# Patient Record
Sex: Female | Born: 1955 | Race: White | Hispanic: No | Marital: Married | Smoking: Former smoker
Health system: Southern US, Community
[De-identification: ages and names within clinical notes are randomized; demographics above are authoritative.]

## PROBLEM LIST (undated history)

## (undated) DIAGNOSIS — T7840XA Allergy, unspecified, initial encounter: Secondary | ICD-10-CM

## (undated) DIAGNOSIS — F419 Anxiety disorder, unspecified: Secondary | ICD-10-CM

## (undated) DIAGNOSIS — M1990 Unspecified osteoarthritis, unspecified site: Secondary | ICD-10-CM

## (undated) DIAGNOSIS — M5030 Other cervical disc degeneration, unspecified cervical region: Secondary | ICD-10-CM

## (undated) DIAGNOSIS — F32A Depression, unspecified: Secondary | ICD-10-CM

## (undated) DIAGNOSIS — K219 Gastro-esophageal reflux disease without esophagitis: Secondary | ICD-10-CM

## (undated) DIAGNOSIS — K52832 Lymphocytic colitis: Secondary | ICD-10-CM

## (undated) DIAGNOSIS — F329 Major depressive disorder, single episode, unspecified: Secondary | ICD-10-CM

## (undated) DIAGNOSIS — F410 Panic disorder [episodic paroxysmal anxiety] without agoraphobia: Secondary | ICD-10-CM

## (undated) DIAGNOSIS — S82009A Unspecified fracture of unspecified patella, initial encounter for closed fracture: Secondary | ICD-10-CM

## (undated) HISTORY — PX: OTHER SURGICAL HISTORY: SHX169

## (undated) HISTORY — PX: COLONOSCOPY: SHX174

## (undated) HISTORY — DX: Panic disorder (episodic paroxysmal anxiety): F41.0

## (undated) HISTORY — DX: Lymphocytic colitis: K52.832

## (undated) HISTORY — DX: Unspecified fracture of unspecified patella, initial encounter for closed fracture: S82.009A

## (undated) HISTORY — DX: Other cervical disc degeneration, unspecified cervical region: M50.30

## (undated) HISTORY — DX: Anxiety disorder, unspecified: F41.9

## (undated) HISTORY — DX: Unspecified osteoarthritis, unspecified site: M19.90

## (undated) HISTORY — DX: Allergy, unspecified, initial encounter: T78.40XA

## (undated) HISTORY — DX: Major depressive disorder, single episode, unspecified: F32.9

## (undated) HISTORY — DX: Depression, unspecified: F32.A

## (undated) HISTORY — PX: ESOPHAGOGASTRODUODENOSCOPY ENDOSCOPY: SHX5814

## (undated) HISTORY — DX: Gastro-esophageal reflux disease without esophagitis: K21.9

---

## 1999-11-22 ENCOUNTER — Encounter: Payer: Self-pay | Admitting: Obstetrics and Gynecology

## 1999-11-22 ENCOUNTER — Encounter: Admission: RE | Admit: 1999-11-22 | Discharge: 1999-11-22 | Payer: Self-pay | Admitting: Obstetrics and Gynecology

## 2001-05-03 ENCOUNTER — Encounter: Admission: RE | Admit: 2001-05-03 | Discharge: 2001-05-03 | Payer: Self-pay | Admitting: Obstetrics and Gynecology

## 2001-05-03 ENCOUNTER — Encounter: Payer: Self-pay | Admitting: Obstetrics and Gynecology

## 2002-05-05 ENCOUNTER — Encounter: Admission: RE | Admit: 2002-05-05 | Discharge: 2002-05-05 | Payer: Self-pay | Admitting: Obstetrics and Gynecology

## 2002-05-05 ENCOUNTER — Encounter: Payer: Self-pay | Admitting: Obstetrics and Gynecology

## 2003-05-08 ENCOUNTER — Encounter: Admission: RE | Admit: 2003-05-08 | Discharge: 2003-05-08 | Payer: Self-pay | Admitting: Obstetrics and Gynecology

## 2004-05-13 ENCOUNTER — Encounter: Admission: RE | Admit: 2004-05-13 | Discharge: 2004-05-13 | Payer: Self-pay | Admitting: Obstetrics and Gynecology

## 2004-05-21 ENCOUNTER — Encounter: Admission: RE | Admit: 2004-05-21 | Discharge: 2004-05-21 | Payer: Self-pay | Admitting: Obstetrics and Gynecology

## 2005-05-14 ENCOUNTER — Encounter: Admission: RE | Admit: 2005-05-14 | Discharge: 2005-05-14 | Payer: Self-pay | Admitting: Obstetrics and Gynecology

## 2006-02-02 ENCOUNTER — Ambulatory Visit: Payer: Self-pay | Admitting: Internal Medicine

## 2006-02-02 ENCOUNTER — Emergency Department (HOSPITAL_COMMUNITY): Admission: EM | Admit: 2006-02-02 | Discharge: 2006-02-02 | Payer: Self-pay | Admitting: Emergency Medicine

## 2006-06-03 ENCOUNTER — Ambulatory Visit: Payer: Self-pay | Admitting: Internal Medicine

## 2006-06-04 ENCOUNTER — Ambulatory Visit: Payer: Self-pay | Admitting: Internal Medicine

## 2006-06-04 ENCOUNTER — Encounter: Payer: Self-pay | Admitting: Internal Medicine

## 2006-06-23 ENCOUNTER — Ambulatory Visit: Payer: Self-pay | Admitting: Internal Medicine

## 2006-09-17 ENCOUNTER — Encounter: Admission: RE | Admit: 2006-09-17 | Discharge: 2006-09-17 | Payer: Self-pay | Admitting: Obstetrics and Gynecology

## 2006-09-23 ENCOUNTER — Encounter: Admission: RE | Admit: 2006-09-23 | Discharge: 2006-09-23 | Payer: Self-pay | Admitting: Obstetrics and Gynecology

## 2007-06-12 DIAGNOSIS — J309 Allergic rhinitis, unspecified: Secondary | ICD-10-CM | POA: Insufficient documentation

## 2007-06-12 DIAGNOSIS — F341 Dysthymic disorder: Secondary | ICD-10-CM | POA: Insufficient documentation

## 2008-03-06 ENCOUNTER — Telehealth: Payer: Self-pay | Admitting: Internal Medicine

## 2008-04-20 ENCOUNTER — Encounter: Admission: RE | Admit: 2008-04-20 | Discharge: 2008-04-20 | Payer: Self-pay | Admitting: Geriatric Medicine

## 2008-05-26 ENCOUNTER — Ambulatory Visit: Payer: Self-pay | Admitting: Internal Medicine

## 2008-05-26 DIAGNOSIS — F410 Panic disorder [episodic paroxysmal anxiety] without agoraphobia: Secondary | ICD-10-CM | POA: Insufficient documentation

## 2008-05-26 DIAGNOSIS — K5289 Other specified noninfective gastroenteritis and colitis: Secondary | ICD-10-CM | POA: Insufficient documentation

## 2008-05-26 DIAGNOSIS — R12 Heartburn: Secondary | ICD-10-CM | POA: Insufficient documentation

## 2008-05-29 ENCOUNTER — Encounter: Payer: Self-pay | Admitting: Internal Medicine

## 2008-12-21 ENCOUNTER — Encounter: Admission: RE | Admit: 2008-12-21 | Discharge: 2008-12-21 | Payer: Self-pay | Admitting: Geriatric Medicine

## 2009-01-01 ENCOUNTER — Encounter: Admission: RE | Admit: 2009-01-01 | Discharge: 2009-01-01 | Payer: Self-pay | Admitting: Geriatric Medicine

## 2009-04-04 ENCOUNTER — Other Ambulatory Visit: Admission: RE | Admit: 2009-04-04 | Discharge: 2009-04-04 | Payer: Self-pay | Admitting: Obstetrics and Gynecology

## 2009-04-17 ENCOUNTER — Ambulatory Visit: Payer: Self-pay | Admitting: Internal Medicine

## 2009-07-31 ENCOUNTER — Encounter: Payer: Self-pay | Admitting: Internal Medicine

## 2009-10-25 ENCOUNTER — Encounter (INDEPENDENT_AMBULATORY_CARE_PROVIDER_SITE_OTHER): Payer: Self-pay | Admitting: *Deleted

## 2009-10-29 ENCOUNTER — Encounter (INDEPENDENT_AMBULATORY_CARE_PROVIDER_SITE_OTHER): Payer: Self-pay

## 2009-10-30 ENCOUNTER — Ambulatory Visit: Payer: Self-pay | Admitting: Internal Medicine

## 2009-11-12 ENCOUNTER — Ambulatory Visit: Payer: Self-pay | Admitting: Internal Medicine

## 2009-11-13 ENCOUNTER — Encounter: Payer: Self-pay | Admitting: Internal Medicine

## 2010-02-11 ENCOUNTER — Encounter: Payer: Self-pay | Admitting: Obstetrics and Gynecology

## 2010-02-19 NOTE — Procedures (Signed)
Summary: Upper Endoscopy  Patient: Tacarra Justo Note: All result statuses are Final unless otherwise noted.  Tests: (1) Upper Endoscopy (EGD)   EGD Upper Endoscopy       DONE     [facility]     [address]     [place], [place]  [address]           ENDOSCOPY PROCEDURE REPORT           PATIENT:  Avalon, Coppinger  MR#:  [id number]     BIRTHDATE:  25-May-1955, 54 yrs. old  GENDER:  female           ENDOSCOPIST:  Hedwig Morton. Juanda Chance, MD     Referred by:  Christia Reading, M.D.           PROCEDURE DATE:  11/12/2009     PROCEDURE:  EGD with biopsy, [address]     ASA CLASS:  Class I     INDICATIONS:  "scratchy throat", s/p ENT evaluation, initially     responded to Nexiem but not recently           MEDICATIONS:   Versed 3 mg, Fentanyl 50 mcg     TOPICAL ANESTHETIC:  Exactacain Spray           DESCRIPTION OF PROCEDURE:   After the risks benefits and     alternatives of the procedure were thoroughly explained, informed     consent was obtained.  The LB GIF-H180 [id number] endoscope was     introduced through the mouth and advanced to the second portion of     the duodenum, without limitations.  The instrument was slowly     withdrawn as the mucosa was fully examined.     <<PROCEDUREIMAGES>>           Normal GE junction was noted. With standard forceps, a biopsy was     obtained and sent to pathology (see image1, image2, image6, and     image7).  The upper, middle, and distal third of the esophagus     were carefully inspected and no abnormalities were noted. The     z-line was well seen at the GEJ. The endoscope was pushed into the     fundus which was normal including a retroflexed view. The     antrum,gastric body, first and second part of the duodenum were     unremarkable (see image9, image8, image5, image4, and image3).     Retroflexed views revealed no abnormalities.    The scope was then     withdrawn from the patient and the procedure completed.           COMPLICATIONS:  None         ENDOSCOPIC IMPRESSION:     1) Normal GE junction     2) Normal EGD     no evidence of esophagitis, hiatal hernia or stricture     RECOMMENDATIONS:     1) Await biopsy results     continue nexiem for now,           REPEAT EXAM:  In 0 year(s) for.           ______________________________     Hedwig Morton. Juanda Chance, MD           CC:  Merlene Laughter, MD           n.     Rosalie DoctorHedwig Morton. Brodie at 11/12/2009 11:21 AM  Page 2 of 3   Abrea, Henle, [id number]  Note: An exclamation mark (!) indicates a result that was not dispersed into the flowsheet. Document Creation Date: 11/12/2009 11:23 AM _______________________________________________________________________  (1) Order result status: Final Collection or observation date-time: 11/12/2009 11:13 Requested date-time:  Receipt date-time:  Reported date-time:  Referring Physician:   Ordering Physician: Lina Sar [phone]) Specimen Source:  Source: Launa Grill Order Number: [id number] Lab site:

## 2010-02-19 NOTE — Letter (Signed)
Summary: Patient Notice-Endo Biopsy Results  [facility]  [address] [place], [place] [address]   Phone: [phone]  Fax: [phone]        November 13, 2009 MRN: [id number]    Vicki Jarvis [address] [place], [place]  [address]    Dear Vicki Jarvis,  I am pleased to inform you that the biopsies taken during your recent endoscopic examination did not show any evidence of cancer upon pathologic examination.The biopsies show mild inflammation due to reflux  Additional information/recommendations:  x  _No further action is needed at this time.  Please follow-up with      your primary care physician for your other healthcare needs.  __ Please call [phone] to schedule a return visit to review      your condition.  _x_ Continue with the treatment plan as outlined on the day of your      exam.  I would consider You seeing a pulmonologist to assess cough/possible upper airway inflammation. Let me know and I would make a referral.   Please call us if you are having persistent problems or have questions about your condition that have not been fully answered at this time.  Sincerely,  Hart Carwin MD  This letter has been electronically signed by your physician.  Appended Document: Patient Notice-Endo Biopsy Results letter mailed 10.28.11

## 2010-02-19 NOTE — Letter (Signed)
Summary: EGD Instructions  [facility]  [address] [place], [place] [address]   Phone: [phone]  Fax: [phone]       Vicki Jarvis    1955/04/29    MRN: [id number]       Procedure Day /Date: 05/15/09 Tuesday     Arrival Time: 10:00 am     Procedure Time: 11:00 am     Location of Procedure:                    _ x _ [facility] (4th Floor)  PREPARATION FOR ENDOSCOPY   On 05/15/09 THE DAY OF THE PROCEDURE:  1.   No solid foods, milk or milk products are allowed after midnight the night before your procedure.  2.   Do not drink anything colored red or purple.  Avoid juices with pulp.  No orange juice.  3.  You may drink clear liquids until 9:00 am, which is 2 hours before your procedure.                                                                                                CLEAR LIQUIDS INCLUDE: Water Jello Ice Popsicles Tea (sugar ok, no milk/cream) Powdered fruit flavored drinks Coffee (sugar ok, no milk/cream) Gatorade Juice: apple, white grape, white cranberry  Lemonade Clear bullion, consomm, broth Carbonated beverages (any kind) Strained chicken noodle soup Hard Candy   MEDICATION INSTRUCTIONS  Unless otherwise instructed, you should take regular prescription medications with a small sip of water as early as possible the morning of your procedure.                  OTHER INSTRUCTIONS  You will need a responsible adult at least 54 years of age to accompany you and drive you home.   This person must remain in the waiting room during your procedure.  Wear loose fitting clothing that is easily removed.  Leave jewelry and other valuables at home.  However, you may wish to bring a book to read or an iPod/MP3 player to listen to music as you wait for your procedure to start.  Remove all body piercing jewelry and leave at home.  Total time from sign-in until discharge is approximately 2-3 hours.  You should go  home directly after your procedure and rest.  You can resume normal activities the day after your procedure.  The day of your procedure you should not:   Drive   Make legal decisions   Operate machinery   Drink alcohol   Return to work  You will receive specific instructions about eating, activities and medications before you leave.    The above instructions have been reviewed and explained to me by   Hortense Ramal CMA Duncan Dull)  April 17, 2009 11:07 AM     I fully understand and can verbalize these instructions _____________________________ Date 04/17/09

## 2010-02-19 NOTE — Letter (Signed)
Summary: Pre Visit Letter Revised  [facility]  [address] [place], [place] [address]   Phone: [phone]  Fax: [phone]        10/25/2009 MRN: [id number] [facility] [address] [place], [place]  [address]             Procedure Date:  11/12/2009   Welcome to the Gastroenterology Division at [facility].    You are scheduled to see a nurse for your pre-procedure visit on 10/30/2009 at 4:30PM on the 3rd floor at [facility], [address]  We ask that you try to arrive at our office 15 minutes prior to your appointment time to allow for check-in.  Please take a minute to review the attached form.  If you answer "Yes" to one or more of the questions on the first page, we ask that you call the person listed at your earliest opportunity.  If you answer "No" to all of the questions, please complete the rest of the form and bring it to your appointment.    Your nurse visit will consist of discussing your medical and surgical history, your immediate family medical history, and your medications.   If you are unable to list all of your medications on the form, please bring the medication bottles to your appointment and we will list them.  We will need to be aware of both prescribed and over the counter drugs.  We will need to know exact dosage information as well.    Please be prepared to read and sign documents such as consent forms, a financial agreement, and acknowledgement forms.  If necessary, and with your consent, a friend or relative is welcome to sit-in on the nurse visit with you.  Please bring your insurance card so that we may make a copy of it.  If your insurance requires a referral to see a specialist, please bring your referral form from your primary care physician.  No co-pay is required for this nurse visit.     If you cannot keep your appointment, please call [phone] to cancel or reschedule prior to your appointment date.   This allows [place] the opportunity to schedule an appointment for another patient in need of care.    Thank you for choosing [facility] for your medical needs.  We appreciate the opportunity to care for you.  Please visit [place] at our website  to learn more about our practice.  Sincerely, The Gastroenterology Division

## 2010-02-19 NOTE — Letter (Signed)
Summary: EGD Instructions  [facility]  [address] [place], [place] [address]   Phone: [phone]  Fax: [phone]       Vicki Jarvis    [birth year]    MRN: [id number]       Procedure Day /Date: Monday 11-12-09     Arrival Time:  10:00 a.m.     Procedure Time: 11:00 a.m.     Location of Procedure:                    _ x _ [facility] (4th Floor)   PREPARATION FOR ENDOSCOPY   On Monday 11-12-09, THE DAY OF THE PROCEDURE:  1.   No solid foods, milk or milk products are allowed after midnight the night before your procedure.  2.   Do not drink anything colored red or purple.  Avoid juices with pulp.  No orange juice.  3.  You may drink clear liquids until 9:00 a.m. , which is 2 hours before your procedure.                                                                                                CLEAR LIQUIDS INCLUDE: Water Jello Ice Popsicles Tea (sugar ok, no milk/cream) Powdered fruit flavored drinks Coffee (sugar ok, no milk/cream) Gatorade Juice: apple, white grape, white cranberry  Lemonade Clear bullion, consomm, broth Carbonated beverages (any kind) Strained chicken noodle soup Hard Candy   MEDICATION INSTRUCTIONS  Unless otherwise instructed, you should take regular prescription medications with a small sip of water as early as possible the morning of your procedure.         OTHER INSTRUCTIONS  You will need a responsible adult at least 54 years of age to accompany you and drive you home.   This person must remain in the waiting room during your procedure.  Wear loose fitting clothing that is easily removed.  Leave jewelry and other valuables at home.  However, you may wish to bring a book to read or an iPod/MP3 player to listen to music as you wait for your procedure to start.  Remove all body piercing jewelry and leave at home.  Total time from sign-in until discharge is approximately 2-3 hours.  You should go  home directly after your procedure and rest.  You can resume normal activities the day after your procedure.  The day of your procedure you should not:   Drive   Make legal decisions   Operate machinery   Drink alcohol   Return to work  You will receive specific instructions about eating, activities and medications before you leave.    The above instructions have been reviewed and explained to me by   Ulis Rias RN  October 30, 2009 4:41 PM     I fully understand and can verbalize these instructions _____________________________ Date _________

## 2010-02-19 NOTE — Medication Information (Signed)
Summary: Nexium Approved/[employer]  Nexium Approved/[employer]   Imported By: Sherian Rein 08/06/2009 07:46:45  _____________________________________________________________________  External Attachment:    Type:   Image     Comment:   External Document

## 2010-02-19 NOTE — Miscellaneous (Signed)
Summary: Lec previsit  Clinical Lists Changes  Observations: Added new observation of ALLERGY REV: Done (10/30/2009 16:05)

## 2010-02-19 NOTE — Assessment & Plan Note (Signed)
Summary: THROAT PAIN/YF    History of Present Illness Visit Type: Follow-up Visit Primary GI MD: Lina Sar MD Primary Provider: Merlene Laughter, MD Requesting Provider: Christia Reading, MD Chief Complaint: Throat pain. Pt denies acid reflux and heartburn  History of Present Illness:   This is a 55 year old white female with scratchiness in her throat and dry throat which started in November 2010. She has been evaluated by Dr Jenne Pane, an ENT specialist but no particular causes have been found. Gastroesophageal reflux has been suspected. Ms. Morash had gastroesophageal reflux in May 2010 which was initially controlled on Kapidex but most recently has been on Nexium 40 mg a day. Increasing the Nexium to 40 mg a day was very effective for 3 weeks but then the symptoms restarted. She denies dysphagia or odynophagia. She feels that her throat scratchiness is different than her usual reflux. There is a history of lymphocytic colitis on a colonoscopy done in May 2008 which resolved on Asacol. She has occasional hoarseness and cough. Patient does not smoke.   GI Review of Systems      Denies abdominal pain, acid reflux, belching, bloating, chest pain, dysphagia with liquids, dysphagia with solids, heartburn, loss of appetite, nausea, vomiting, vomiting blood, weight loss, and  weight gain.        Denies anal fissure, black tarry stools, change in bowel habit, constipation, diarrhea, diverticulosis, fecal incontinence, heme positive stool, hemorrhoids, irritable bowel syndrome, jaundice, light color stool, liver problems, rectal bleeding, and  rectal pain.    Current Medications (verified): 1)  Nexium 40 Mg  Cpdr (Esomeprazole Magnesium) .... 2 Capsules Each Day 30 Minutes Before Meal 2)  Mylanta 200-200-20 Mg/70ml Susp (Alum & Mag Hydroxide-Simeth) .... By Mouth Once Daily As Needed Heartburn 3)  Zyrtec Allergy 10 Mg Tabs (Cetirizine Hcl) .... One Tablet By Mouth As Needed For Allergies 4)  Melatonin 3 Mg  Tabs (Melatonin) .... Pme Tablet By Mouth At Bedtime Once Daily 5)  Allergy Shots .... Once Weekly  Allergies (verified): 1)  ! Pcn 2)  ! Doxycycline  Past History:  Past Medical History: Reviewed history from 05/26/2008 and no changes required. Current Problems:  HEARTBURN (ICD-787.1) PANIC DISORDER (ICD-300.01) Family Hx of COLON CANCER (ICD-153.9) COLITIS (ICD-558.9) ALLERGIC RHINITIS, CHRONIC (ICD-477.9) ANXIETY DEPRESSION (ICD-300.4)    Past Surgical History: Reviewed history from 05/26/2008 and no changes required. Unremarkable  Family History: Reviewed history from 05/26/2008 and no changes required. Family History of Breast Cancer: Mother Family History of Colon Cancer: Grandmother Family History of Colitis/Crohn's: Mother (Ulcerative Colitis as a child?) Family History of Heart Disease: Mother, Grandfather, Uncle  Social History: Occupation: Research scientist (medical) Married Patient is a former smoker: quit at 55yrs old  Alcohol Use - no Illicit Drug Use - no Daily Caffeine Use: occ  Smoking Status:  quit  Review of Systems       The patient complains of sore throat, thirst - excessive, and vision changes.  The patient denies allergy/sinus, anemia, anxiety-new, arthritis/joint pain, back pain, blood in urine, breast changes/lumps, change in vision, confusion, cough, coughing up blood, depression-new, fainting, fatigue, fever, headaches-new, hearing problems, heart murmur, heart rhythm changes, itching, menstrual pain, muscle pains/cramps, night sweats, nosebleeds, pregnancy symptoms, shortness of breath, skin rash, sleeping problems, swelling of feet/legs, swollen lymph glands, thirst - excessive , urination - excessive , urination changes/pain, urine leakage, and voice change.         Pertinent positive and negative review of systems were noted in the above  HPI. All other ROS was otherwise negative.   Vital Signs:  Patient profile:   55 year old female Height:       64 inches Weight:      137 pounds BMI:     23.60 BSA:     1.67 Pulse rate:   88 / minute Pulse rhythm:   regular BP sitting:   110 / 64  (left arm) Cuff size:   regular  Vitals Entered By: Ok Anis CMA (April 17, 2009 10:15 AM)  Physical Exam  General:  alert, oriented with normal voice. No cough. Mouth:  moist mucous membranes of the mouth, no lesions. Neck:  Supple; no masses or thyromegaly. Lungs:  Clear throughout to auscultation. Heart:  Regular rate and rhythm; no murmurs, rubs,  or bruits. Abdomen:  Soft, nontender and nondistended. No masses, hepatosplenomegaly or hernias noted. Normal bowel sounds. Extremities:  No clubbing, cyanosis, edema or deformities noted. Skin:  Intact without significant lesions or rashes. Psych:  Alert and cooperative. Normal mood and affect.   Impression & Recommendations:  Problem # 1:  HEARTBURN (ICD-787.1)  Patient has throat soreness and dryness and is status negative ENT evaluation. Her symptoms according to her are not suggestive of reflux but she has been relieved since increasing her Nexium to 40 mg twice a day; at least temporarily. Since she has a history of gastroesophageal reflux dating back to 2003, we will go ahead with an upper endoscopy to delineate the anatomy and to rule out a hiatal hernia and Barrett's esophagus. She may be a candidate for a 24 hour intraesophageal pH probe depending on the results of endoscopy. For now, she will continue on Nexium 40 mg daily.  Orders: EGD (EGD)  Problem # 2:  Family Hx of COLON CANCER (ICD-153.9) Patient's last colonoscopy was in May 2008. A recall colonoscopy will be due in May 2013.  Patient Instructions: 1)  Nexium 40 mg daily. 2)  Carafate slurry 10 cc p.o. b.i.d. 3)  Upper endoscopy. 4)  Recall colonoscopy May 2013. 5)  Copy sent to : Dr Pete Glatter, Dr Jenne Pane 6)  The medication list was reviewed and reconciled.  All changed / newly prescribed medications were explained.  A  complete medication list was provided to the patient / caregiver. Prescriptions: CARAFATE 1 GM/10ML SUSP (SUCRALFATE) Take 2 teaspoons (10ml) by mouth two times a day  #12 ounces x 0   Entered by:   Hortense Ramal CMA (AAMA)   Authorized by:   Hart Carwin MD   Signed by:   Hortense Ramal CMA (AAMA) on 04/17/2009   Method used:   Electronically to        [facility] [address] (retail)       [address]       [place], [place]  [address]       Ph: [phone] or [phone]       Fax: [phone]   RxID:   [id number]

## 2010-06-04 NOTE — Assessment & Plan Note (Signed)
[facility]                         GASTROENTEROLOGY OFFICE NOTE   NAME:COMBSAlle, Jarvis                       MRN:          [id number]  DATE:06/03/2006                            DOB:          1955/11/26    Vicki Jarvis is a very nice 55 year old white female, who is here today at  the referral of Vicki Jarvis for acute diarrheal illness, which started  about four weeks ago.  Her normal bowel habits are somewhat constipated.  About four weeks ago, she developed rather sudden watery diarrhea,  without any abdominal pain.  There was no fever, no blood.  Her husband  has been healthy and none of her friends had similar illness.  She  denies traveling.  She denies taking antibiotics.  She has no history of  previous diarrheal illnesses.  Her mother, who is our patient,  apparently had ulcerative colitis as a child and her grandmother had  colon cancer.  Patient herself never had a colonoscopy.  She has been on  a weight-reduction diet for the past several months, on NutraSystem, and  lost about 20 pounds.  There has been additional three-pound weight-loss  since the onset of diarrhea.  The diarrhea initially continued through  day and night, up to 20 or 30 times a day for the first week, but it has  slowed down after Vicki Jarvis put patient on Lomotil two tablets three  times a day.  She has now four to five loose stools a day, but not  nocturnally.   MEDICATIONS:  1. Birth control pills.  2. Xanax 0.25 mg daily.  3. Zoloft 100 mg daily.  4. Allergy shots.  5. Luvox.   PAST HISTORY:  Anxiety, panic disorder, depression, allergies.   FAMILY HISTORY:  Mother had bleeding ulcer.  Heart disease in mother,  grandfather and uncle.  Breast cancer in mother.   SOCIAL HISTORY:  Married, no children.  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  She does not smoke and does not drink  alcohol.   REVIEW OF SYSTEMS:  Recent intentional  weight-loss of about 20 pounds.  She has allergies, sleeping problems, back pain, anxiety, new depression  and shortness of breath, evaluated in January 2008, in the emergency  room.   PHYSICAL EXAM:  Blood pressure 192/62, pulse 88 and weight 117 pounds.  She was alert, oriented, in no distress.  Skin was warm and dry.  There  were no skin lesions.  Sclera is anicteric.  The neck was supple, there  was no adenopathy.  There was no resting tremor.  Lungs were clear to  auscultation.  Cor with somewhat rapid S1 and S2.  Abdomen was soft,  with somewhat hyperactive bowel sounds, mild distention, no tympany, no  tenderness, no palpable mass.  Liver edge at costal margin.  No scars.  Rectal exam shows normal perianal area.  Rectal tone was normal.  Rectal  ampulla was empty; there was no stool.  Mucus was heme-negative.   LABORATORY DATA:  Laboratory data from Dr. Blair Jarvis office showed  negative stool cultures  and O&P.  Her blood chemistry showed normal,  including white cell count, hemoglobin which was somewhat concentrated,  but her serum albumin and liver function test were normal.   IMPRESSION:  [age]-year-old white female with acute onset of diarrheal  illness, most likely infectious.  In spite of negative stool culture, I  feel that it could have been either viral gastroenteritis or possible  bacterial gastroenteritis.  The other possibility would be that we are  dealing with post-infectious irritable bowel syndrome; after diarrheal  infection has passed, she is still having diarrheal illness.  I think  the Lomotil is masking a lot of her symptoms, since she takes six a day.  There is some question of a family history of inflammatory bowel disease  in her mother and the patient's diarrhea possibly could represent new  onset of inflammatory bowel disease, but the sudden onset speaks more  for infectious etiology, rather than inflammatory bowel disease.   PLAN:  1. Empirical Flagyl  250 mg p.o. t.i.d.  2. Cipro 250 mg p.o. b.i.d. for next ten days.  3. Low-residue diet.  4. Levbid 0.375 mg p.o. b.i.d. as antispasmodic.  5. Colonoscopy scheduled.  Colonoscopy will also be a screening test      because of the family history of colon cancer, as well as the fact      that she is 55 years old.  We will be able to rule out any sign of      inflammatory bowel disease.     Vicki Jarvis. Vicki Chance, MD     DMB/MedQ  DD: 06/03/2006  DT: 06/03/2006  Job #: [id number]   cc:   Vicki Jarvis. Artis Jarvis, M.D.

## 2010-06-04 NOTE — Assessment & Plan Note (Signed)
[facility]                         GASTROENTEROLOGY OFFICE NOTE   NAME:COMBSLilyanah, Celestin                       MRN:          [id number]  DATE:06/23/2006                            DOB:          12/30/1955    Ms. Mckain is a 55 year old white female with acute onset of diarrhea  approximately two months ago.  She was evaluated for infectious colitis  and was treated empirically with Cipro and Flagyl without much  improvement.  Colonoscopy on Jun 04, 2006 showed an essentially normal  colon, but the biopsies confirmed the presence of lymphocytic colitis.  She was started on Asacol 400 mg three tablets twice daily with pretty  rapid resolution of the diarrhea.  Unfortunately, about five days into  taking Asacol, she developed tingling at the tip of her fingers.  No  other symptoms.  We have decreased her Asacol to 800 mg twice daily.  The tingling went away or was slightly decreased, but her diarrhea  became slightly worse.  It is really not clear whether or not the  tingling is entirely related to the Asacol.  She took two Asacol today,  and she is not having any tingling right at the moment, but she had some  last night.   IMPRESSION:  A 55 year old white female with newly diagnosed lymphocytic  colitis, who is responding to mesalamine products but with side effects  of tingling.   PLAN:  First of all, we will have to demonstrate a causal relationship  between Asacol and her tingling, so I asked her to discontinue the  Asacol entirely for about four days.  If the tingling goes away, she  will restart it at a low dose of one tablet twice daily, that is 400 mg  b.i.d.  If the tingling starts, she is to call us, and I will try her on  Azulfidine 500 mg p.o. b.i.d.  If the tingling does not recur with low-  dose Asacol, she is allowed to increase the Asacol to three tablets a  day and eventually four tablets a day.  I also have given her samples of  Align  to take one daily to improve her bacterial flora.  She will be in  touch with [place] over the phone.     Hedwig Morton. Juanda Chance, MD  Electronically Signed    DMB/MedQ  DD: 06/23/2006  DT: 06/24/2006  Job #: [id number]   cc:   Quita Skye. Artis Flock, M.D.

## 2010-06-07 NOTE — Consult Note (Signed)
NAMEFRANCELY, CRAW NO.:  [id number]   MEDICAL RECORD NO.:  [id number]          PATIENT TYPE:  EMS   LOCATION:  [facility]                         FACILITY:  [facility]   PHYSICIAN:  Nicolasa Ducking, ANP DATE OF BIRTH:  [birth year]   DATE OF CONSULTATION:  02/02/2006  DATE OF DISCHARGE:                                 CONSULTATION   ADDENDUM:  Please carbon copy the previously dictated consult, job  number [id number] to Dr. Dahlia Client in __________.      Nicolasa Ducking, ANP     CB/MEDQ  D:  02/02/2006  T:  02/03/2006  Job:  [id number]

## 2010-06-07 NOTE — Consult Note (Signed)
NAMECHARL, Vicki Jarvis NO.:  [id number]   MEDICAL RECORD NO.:  [id number]          PATIENT TYPE:  EMS   LOCATION:  [facility]JO                         FACILITY:  [facility]   PHYSICIAN:  Bevelyn Buckles. Bensimhon, MDDATE OF BIRTH:  [birth year]   DATE OF CONSULTATION:  DATE OF DISCHARGE:                                 CONSULTATION   PRIMARY CARE PHYSICIAN:  Dr. Bradd Canary.   PRIMARY CARDIOLOGIST:  Patient is new to [facility], being seen  by Dr. Gala Romney today; however, she lives in [place] and will  likely follow up with Dr. Jens Som.   PATIENT PROFILE:  A 55 year old married Caucasian female with no prior  history of CAD or significant medical history who presented to the ED  today with a 2-day history of chest pain.   PROBLEM LIST:  1. Chest pain.  2. History of cyst removal of the thigh and right earlobe.  3. History of mononucleosis as a teenager.   HISTORY OF PRESENT ILLNESS:  A 55-yeaer-old married Caucasian female  with no prior history of coronary artery disease or significant medical  history.  She was in her usual state of health until Saturday, January  12th, when she was sitting watching television in the afternoon and got  up to answer a telephone and had sudden onset 8/10 substernal chest  heaviness with mild shortness of breath.  The pain was slightly worse  with deep breathing as well as with palpation.  She continued to have  chest pain at some degree throughout the weekend that ranged from 3/10  to 8/10 and there was no change in symptoms with exertion, movement of  the arms or shoulders, or twisting of the upper body, although as noted,  symptoms were worsened with deep breathing and palpation.  This morning,  she felt somewhat better; however, went to see her primary care  Mechille Varghese, secondary to the constant chest discomfort, an ECG was  performed showed no acute changes, and then she was sent to the [facility] ED.  Here, she received a  dose aspirin and started feeling better  around 3 p.m.  She is now comfortable.  She denies any PND, orthopnea,  dizziness, syncope, edema, or early satiety.   ALLERGIES:  DICLOXACILLIN AND PENICILLIN BOTH CAUSE HIVES.   HOME MEDICATIONS:  Ortho-Cyclen daily.   FAMILY HISTORY:  Mother is age 47 with a history of MI in her late 90s  as well a history of breast cancer.  Father died at age 54.  He did have  a stroke and possibly had coronary disease in his lifetime.  She has a  brother who has hypertension and a history of irregular heartbeat.   SOCIAL HISTORY:  She lives in [place] with her husband.  She used  to smoke 1 pack of cigarettes per week for about 9 years between the  ages of 73 and 86 but has not smoked anything since then.  She denies  any alcohol or drugs and does not routinely exercise.  She does not  notice any limitations with her normal activities.  REVIEW OF SYSTEMS:  Positive for chest pain and mild shortness of  breath, as outlined in the HPI.  All other systems reviewed are  negative.   PHYSICAL EXAMINATION:  VITAL SIGNS:  Temperature 97.0, heart rate 84,  respirations 18, blood pressure 122/77, pulse ox 100% on room air.  GENERAL:  Pleasant white female in no acute distress.  Awake, alert, and  oriented x3.  NECK:  Normal carotid upstrokes.  No bruits or JVD.  LUNGS:  Respirations regular and unlabored.  Clear to auscultation.  CARDIAC:  Regular S1, S2.  No S3, S4, murmurs.  ABDOMEN:  Round, soft, nontender, nondistended.  Bowel sounds present  x4.  EXTREMITIES:  Warm, dry, and pink.  No clubbing, cyanosis, or edema.  Dorsalis pedis and posterior tibial pulses 2+ equal and bilaterally.  HEENT:  Atraumatic, normocephalic.  NEURO:  Grossly intact, nonfocal.  SKIN:  Warm and dry, without lesions or masses.  CHEST X-RAY:  Shows no acute disease.   CT of the chest was negative for PE.  The aorta was normal and the lungs  were clear.  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  Radiology suggests elective MRI and followup.   EKG shows sinus rhythm with a normal axis at a rate of 86 bpm.  There is  no acute ST-T changes.   LABORATORY DATA:  Hemoglobin 16.2, hematocrit 46.7, WBC 7.2, platelets  were clumping.  Sodium 135, potassium 3.8, chloride 99, CO2 25, BUN 8,  creatinine 0.59, glucose 69.  AST 19, ALT 11, total protein 8.4, albumin  4.3  D-dimer 0.72.  CK-MB less than 1.0.  Troponin-I less than 0.05.  Calcium 9.7.   ASSESSMENT/PLAN:  1. Chest pain, fairly atypical.  It was constant at times,      approximately 48 hours to varying degrees with a pleuritic      component as well as worsening with palpation.  She is now feeling      better.  Cardiac enzymes are negative x1 despite this constant      chest pain.  D-dimer was elevated; however, CT of the chest was      negative for pulmonary embolism.  There is no acute ST-T changes.      We will discharge from the emergency room with outpatient      echocardiogram and Myoview in followup.  She can then follow up      with Dr. Jens Som in [place].  I have called the office and      left a message regarding followup plans, and our office will      contact the patient once appointments are scheduled.  2. Questionable hepatic hemangioma.  The patient will require an      outpatient MRI to reevaluate per radiology recommendation.  This      can be followed up with Dr. Artis Flock as an outpatient.      Nicolasa Ducking, ANP      Bevelyn Buckles. Bensimhon, MD  Electronically Signed    CB/MEDQ  D:  02/02/2006  T:  02/03/2006  Job:  [id number]

## 2011-09-10 ENCOUNTER — Other Ambulatory Visit: Payer: Self-pay | Admitting: Geriatric Medicine

## 2011-09-10 DIAGNOSIS — N6009 Solitary cyst of unspecified breast: Secondary | ICD-10-CM

## 2011-09-24 ENCOUNTER — Ambulatory Visit
Admission: RE | Admit: 2011-09-24 | Discharge: 2011-09-24 | Disposition: A | Discharge status: Home / Self Care | Payer: BC Managed Care – PPO | Source: Ambulatory Visit | Attending: Geriatric Medicine | Admitting: Geriatric Medicine

## 2011-09-24 DIAGNOSIS — N6009 Solitary cyst of unspecified breast: Secondary | ICD-10-CM

## 2013-04-21 ENCOUNTER — Other Ambulatory Visit: Payer: Self-pay

## 2013-04-21 DIAGNOSIS — Z1231 Encounter for screening mammogram for malignant neoplasm of breast: Secondary | ICD-10-CM

## 2013-05-04 ENCOUNTER — Ambulatory Visit
Admission: RE | Admit: 2013-05-04 | Discharge: 2013-05-04 | Disposition: A | Discharge status: Home / Self Care | Payer: BC Managed Care – PPO | Source: Ambulatory Visit

## 2013-05-04 DIAGNOSIS — Z1231 Encounter for screening mammogram for malignant neoplasm of breast: Secondary | ICD-10-CM

## 2014-06-07 ENCOUNTER — Telehealth: Payer: Self-pay | Admitting: Internal Medicine

## 2014-06-07 NOTE — Telephone Encounter (Signed)
Hx of lymphocytic colitis in 2008, responded to Asacal. Please send Lialda 1.2 gm, 1 po bid,#60

## 2014-06-07 NOTE — Telephone Encounter (Signed)
Patient calling to report several months of abdominal pain, diarrhea off and on, and rectal leakage. She denies recent antibiotics or travel. Hx of lymphocytic colitis. Scheduled with Dr. Olevia Perches on 06/13/14 at 8:45 AM.

## 2014-06-08 MED ORDER — MESALAMINE 1.2 G PO TBEC: DELAYED_RELEASE_TABLET | ORAL | Status: DC

## 2014-06-08 NOTE — Telephone Encounter (Signed)
Spoke with patient and gave her recommendation. Rx sent to pharmacy. 

## 2014-06-12 ENCOUNTER — Encounter: Payer: Self-pay | Admitting: *Deleted

## 2014-06-13 ENCOUNTER — Ambulatory Visit (INDEPENDENT_AMBULATORY_CARE_PROVIDER_SITE_OTHER): Payer: BC Managed Care – PPO | Admitting: Internal Medicine

## 2014-06-13 ENCOUNTER — Encounter: Payer: Self-pay | Admitting: Internal Medicine

## 2014-06-13 ENCOUNTER — Other Ambulatory Visit (INDEPENDENT_AMBULATORY_CARE_PROVIDER_SITE_OTHER): Payer: BC Managed Care – PPO

## 2014-06-13 VITALS — BP 120/82 | HR 84 | Ht 63.25 in | Wt 124.0 lb

## 2014-06-13 DIAGNOSIS — R197 Diarrhea, unspecified: Secondary | ICD-10-CM

## 2014-06-13 DIAGNOSIS — K52832 Lymphocytic colitis: Secondary | ICD-10-CM

## 2014-06-13 DIAGNOSIS — K5289 Other specified noninfective gastroenteritis and colitis: Secondary | ICD-10-CM | POA: Diagnosis not present

## 2014-06-13 LAB — SEDIMENTATION RATE: SED RATE: 21 mm/h (ref 0–22)

## 2014-06-13 MED ORDER — BUDESONIDE 3 MG PO CPEP: 3.0000 mg | ORAL_CAPSULE | Freq: Every day | ORAL | Status: DC

## 2014-06-13 NOTE — Progress Notes (Signed)
Vicki Jarvis 04-09-1955 [id number]  Note: This dictation was prepared with Dragon digital system. Any transcriptional errors that result from this procedure are unintentional.   History of Present Illness: This is a 59 year old white female with history of lymphocytic colitis diagnosed on colonoscopy in May 2008, she responded to Asacol. She has now recurrence of diarrhea more so in the last 4-6 months. She has crampy abdominal pain and loose stools which occur during the day as well as at night. She also experiences leakage of the stool. There has been no blood.per rectum. Her weight has been stable. There is a family history of ulcerative colitis in her mother and colon cancer in her grandmother. She was started on mesalamine 1.2 g twice a day 4 days ago after she called our office but has not noticed any significant difference yet.    Past Medical History  Diagnosis Date  . GERD (gastroesophageal reflux disease)   . Panic disorder   . Colitis   . Anxiety   . Depression     Past Surgical History  Procedure Laterality Date  . Colonoscopy    . Esophagogastroduodenoscopy endoscopy      Allergies  Allergen Reactions  . Doxycycline     REACTION: itching, hives  . Penicillins     REACTION: itchings, hives    Family history and social history have been reviewed.  Review of Systems: Stable weight. Denies rectal bleeding. Positive for crampy abdominal pain  The remainder of the 10 point ROS is negative except as outlined in the H&P  Physical Exam: General Appearance thin, in no distress Eyes  Non icteric  HEENT  Non traumatic, normocephalic  Mouth No lesion, tongue papillated, no cheilosis Neck Supple without adenopathy, thyroid not enlarged, no carotid bruits, no JVD Lungs Clear to auscultation bilaterally COR Normal S1, normal S2, regular rhythm, no murmur, quiet precordium Abdomen soft diffusely tender. Nondistended. Normoactive bowel sounds. No palpable mass. Liver edge  at costal margin Rectal normal rectal sphincter tone. Small amount of Hemoccult-negative stool Extremities  No pedal edema Skin No lesions Neurological Alert and oriented x 3 Psychological Normal mood and affect  Assessment and Plan:   59 year old white female with history of lymphocytic colitis with recurrence of diarrhea most likely due to ongoing colitis. We have discussed  Asacol versus starting budesonide which is more standard therapy and likely more effective. She favors starting budesonide. We will also recheck sprue profile, sedimentation rate. Stool cultures for pathogen and lactoferrin. She will begin budesonide 9 mg daily for 4 weeks and reduce the dose to 6 mg daily for 4 weeks and subsequently 3 mg daily for 4 weeks. I will see her in 8 weeks. If the symptoms do not subside I will consider  colonoscopy since it has been 8 years from her last exam    Delfin Edis 06/13/2014

## 2014-06-13 NOTE — Patient Instructions (Addendum)
Go to the basement for labs today Use Entocort 3 mg three times a day for 1 month, then two times a day for 1 month then 1 a day for 1 month, then follow up with Dr Olevia Perches Your follow up appointment with Dr Olevia Perches is scheduled on 08/15/2014 Tuesday at 8:15am  Dr Felipa Eth

## 2014-06-14 ENCOUNTER — Telehealth: Payer: Self-pay | Admitting: Internal Medicine

## 2014-06-14 DIAGNOSIS — K52832 Lymphocytic colitis: Secondary | ICD-10-CM

## 2014-06-14 DIAGNOSIS — R197 Diarrhea, unspecified: Secondary | ICD-10-CM

## 2014-06-14 LAB — GLIA (IGA/G) + TTG IGA
GLIADIN IGG: 1 U (ref <20)
Gliadin IgA: 5 Units (ref <20)
TISSUE TRANSGLUTAMINASE AB, IGA: 1 U/mL (ref <4)

## 2014-06-14 LAB — FECAL LACTOFERRIN, QUANT: LACTOFERRIN: NEGATIVE

## 2014-06-14 MED ORDER — BUDESONIDE 3 MG PO CPEP: ORAL_CAPSULE | ORAL | Status: DC

## 2014-06-14 NOTE — Telephone Encounter (Signed)
Pharmacy gave patient her prescription for 90 pills taking one by mouth daily ....Marland KitchenMarland KitchenPatinet is upset thinking she will not be able to get the rest of her taper. I sent in for #100 pills as requested by Dr Olevia Perches but pharmacy only gave her 90 pills .,.  Will contact pharmacy and her insurance to get an override to get her the correct amount of pills

## 2014-06-14 NOTE — Telephone Encounter (Signed)
Per [employer] there is a 90 day limit but no quanity limit. [place] said to have pharmacy rerun claim for 90 days supply at 180 pills. So I will send in script for the remaining balance   Sent in new script for the 90 remaining balance for her taper. Will speak with pharmacist and make him aware also   Spoke with patient and told her that everything was going to be ok with her prescription. Pharmacist Santa Genera said he would delet and fix the script from yesterday as a 30 days supply and have her next 90 on hold waiting for her when she is ready

## 2014-06-21 LAB — GASTROINTESTINAL PATHOGEN PANEL PCR
C. DIFFICILE TOX A/B, PCR: NEGATIVE
CRYPTOSPORIDIUM, PCR: NEGATIVE
Campylobacter, PCR: NEGATIVE
E COLI (STEC) STX1/STX2, PCR: NEGATIVE
E COLI 0157, PCR: NEGATIVE
E coli (ETEC) LT/ST PCR: NEGATIVE
GIARDIA LAMBLIA, PCR: NEGATIVE
NOROVIRUS, PCR: NEGATIVE
ROTAVIRUS, PCR: NEGATIVE
SHIGELLA, PCR: NEGATIVE
Salmonella, PCR: NEGATIVE

## 2014-08-15 ENCOUNTER — Ambulatory Visit: Payer: BC Managed Care – PPO | Admitting: Internal Medicine

## 2014-08-18 ENCOUNTER — Ambulatory Visit (INDEPENDENT_AMBULATORY_CARE_PROVIDER_SITE_OTHER): Payer: BC Managed Care – PPO | Admitting: Internal Medicine

## 2014-08-18 ENCOUNTER — Encounter: Payer: Self-pay | Admitting: Internal Medicine

## 2014-08-18 VITALS — BP 100/62 | HR 68 | Ht 63.25 in | Wt 123.4 lb

## 2014-08-18 DIAGNOSIS — R197 Diarrhea, unspecified: Secondary | ICD-10-CM | POA: Diagnosis not present

## 2014-08-18 DIAGNOSIS — K52832 Lymphocytic colitis: Secondary | ICD-10-CM

## 2014-08-18 DIAGNOSIS — K5289 Other specified noninfective gastroenteritis and colitis: Secondary | ICD-10-CM | POA: Diagnosis not present

## 2014-08-18 MED ORDER — BUDESONIDE 3 MG PO CPEP: ORAL_CAPSULE | ORAL | Status: DC

## 2014-08-18 NOTE — Patient Instructions (Addendum)
We have sent the following medications to your pharmacy for you to pick up at your convenience:budesondie.  Restart your Lialda 1.2 g daily.  You can take over the counter Imodium 2-3 x a week.   Please follow up with Dr. Silverio Decamp. Dr Felipa Eth

## 2014-08-18 NOTE — Progress Notes (Signed)
Vicki Jarvis 1955-09-06 [id number]  Note: This dictation was prepared with Dragon digital system. Any transcriptional errors that result from this procedure are unintentional.   History of Present Illness: This is a 59 year old female with history of lymphocytic colitis diagnosed on colonoscopy in May 2008. Random biopsies of the colon showed inflammatory changes including lamina propria and increased intraepithelial lymphocytes. Her celiac panel is negative.In May 2016  stool lactoferrin negative for pathogens negative. Sedimentation rate 21. She has  had a recurrent diarrhea since 6 months ago having several loose stools daily as well as leakage of the stool during the day as well as at night. There is no abdominal pain. She has a history of ulcerative colitis in her mother and colon cancer in a grandmother. She was in the past treated with mesalamine but this time she did not respond to Lialda  so she was started on budesonide 9 mg daily Planning to taper it every 4 weeks  This is a follow-up appointment  Today she reports marked improvement after budesonide started at the dose of 9 mg daily x 1 month. Once she dropped down to 6 mg a day she started having softer stools but was still feeling well. One week ago she decreased to 3 mg daily and is having slightly  looser stools but still doing quite well.    Past Medical History  Diagnosis Date  . GERD (gastroesophageal reflux disease)   . Panic disorder   . Colitis   . Anxiety   . Depression     Past Surgical History  Procedure Laterality Date  . Colonoscopy    . Esophagogastroduodenoscopy endoscopy      Allergies  Allergen Reactions  . Doxycycline     REACTION: itching, hives  . Penicillins     REACTION: itchings, hives    Family history and social history have been reviewed.  Review of Systems:   The remainder of the 10 point ROS is negative except as outlined in the H&P  Physical Exam: General Appearance Well developed,  in no distress Eyes  Non icteric  HEENT  Non traumatic, normocephalic  Mouth No lesion, tongue papillated, no cheilosis Neck Supple without adenopathy, thyroid not enlarged, no carotid bruits, no JVD Lungs Clear to auscultation bilaterally COR Normal S1, normal S2, regular rhythm, no murmur, quiet precordium Abdomen Hyperactive bowel sounds. Soft nontender no distention or tympany  Rectal Not done  Extremities  No pedal edema Skin No lesions Neurological Alert and oriented x 3 Psychological Normal mood and affect  Assessment and Plan:   59 year old white female with the recurrence of her lymphocytic colitis initially diagnosed in 2008. Improved on budesonide but having recurrence of diarrhea as the steroids have been tapered. She is still doing quite well. She will experiment with different doses of Budesonide  Such as 3 mg every other day and  will add Imodium as needed. I also asked her to restart Lialda  1.2 g daily because she did not take it long enough. . She will be followed by Dr. Donneta Romberg 08/18/2014

## 2014-12-05 ENCOUNTER — Encounter: Payer: Self-pay | Admitting: Internal Medicine

## 2014-12-07 ENCOUNTER — Other Ambulatory Visit: Payer: Self-pay

## 2014-12-07 DIAGNOSIS — Z1231 Encounter for screening mammogram for malignant neoplasm of breast: Secondary | ICD-10-CM

## 2015-01-09 ENCOUNTER — Ambulatory Visit
Admission: RE | Admit: 2015-01-09 | Discharge: 2015-01-09 | Disposition: A | Discharge status: Home / Self Care | Payer: BC Managed Care – PPO | Source: Ambulatory Visit

## 2015-01-09 DIAGNOSIS — Z1231 Encounter for screening mammogram for malignant neoplasm of breast: Secondary | ICD-10-CM

## 2015-01-11 ENCOUNTER — Ambulatory Visit: Payer: BC Managed Care – PPO

## 2015-12-24 ENCOUNTER — Emergency Department (HOSPITAL_COMMUNITY)
Admission: EM | Admit: 2015-12-24 | Discharge: 2015-12-24 | Disposition: A | Discharge status: Home / Self Care | Payer: BC Managed Care – PPO | Attending: Emergency Medicine | Admitting: Emergency Medicine

## 2015-12-24 ENCOUNTER — Encounter (HOSPITAL_COMMUNITY): Payer: Self-pay

## 2015-12-24 ENCOUNTER — Emergency Department (HOSPITAL_COMMUNITY): Payer: BC Managed Care – PPO

## 2015-12-24 DIAGNOSIS — Y92480 Sidewalk as the place of occurrence of the external cause: Secondary | ICD-10-CM | POA: Diagnosis not present

## 2015-12-24 DIAGNOSIS — Z87891 Personal history of nicotine dependence: Secondary | ICD-10-CM | POA: Insufficient documentation

## 2015-12-24 DIAGNOSIS — M25571 Pain in right ankle and joints of right foot: Secondary | ICD-10-CM

## 2015-12-24 DIAGNOSIS — Y999 Unspecified external cause status: Secondary | ICD-10-CM | POA: Diagnosis not present

## 2015-12-24 DIAGNOSIS — W109XXA Fall (on) (from) unspecified stairs and steps, initial encounter: Secondary | ICD-10-CM | POA: Diagnosis not present

## 2015-12-24 DIAGNOSIS — Y939 Activity, unspecified: Secondary | ICD-10-CM | POA: Diagnosis not present

## 2015-12-24 DIAGNOSIS — S99921A Unspecified injury of right foot, initial encounter: Secondary | ICD-10-CM | POA: Diagnosis present

## 2015-12-24 DIAGNOSIS — S90811A Abrasion, right foot, initial encounter: Secondary | ICD-10-CM | POA: Diagnosis not present

## 2015-12-24 MED ORDER — TETANUS-DIPHTH-ACELL PERTUSSIS 5-2.5-18.5 LF-MCG/0.5 IM SUSP
0.5000 mL | Freq: Once | INTRAMUSCULAR | Status: AC
  Administered 2015-12-24: 0.5 mL via INTRAMUSCULAR
  Filled 2015-12-24: qty 0.5

## 2015-12-24 MED ORDER — HYDROCODONE-ACETAMINOPHEN 5-325 MG PO TABS: 1.0000 | ORAL_TABLET | Freq: Four times a day (QID) | ORAL | 0 refills | Status: DC | PRN

## 2015-12-24 MED ORDER — OXYCODONE-ACETAMINOPHEN 5-325 MG PO TABS
1.0000 | ORAL_TABLET | Freq: Once | ORAL | Status: AC
  Administered 2015-12-24: 1 via ORAL
  Filled 2015-12-24: qty 1

## 2015-12-24 NOTE — ED Triage Notes (Signed)
Pt presents with c/o fall that occurred last evening. Pt reports she fell off of the stairs and landed on the sidewalk on her right ankle. Pt does have some swelling to that right ankle. Pt also reports her left calf is also sore as well as her left ankle but has not noticed any swelling to the left ankle.

## 2015-12-24 NOTE — ED Notes (Signed)
Bed: WTR5 Expected date:  Expected time:  Means of arrival:  Comments: 

## 2015-12-24 NOTE — ED Provider Notes (Signed)
[place] DEPT Provider Note   CSN: [id number] Arrival date & time: 12/24/15  1024  By signing my name below, I, Emmanuella Mensah, attest that this documentation has been prepared under the direction and in the presence of Etta Quill, NP. Electronically Signed: Judithann Sauger, ED Scribe. 12/24/15. 11:47 AM.   History   Chief Complaint Chief Complaint  Patient presents with  . Fall  . Ankle Pain   HPI Comments: Vicki Jarvis is a 60 y.o. female who presents to the Emergency Department complaining of gradually worsening moderate right ankle pain with swelling s/p fall that occurred at 7 pm yesterday. She reports associated mild pain to her left anterior lower leg. She explains that she tripped and fell off the steps of her porch unto the sidewalk, landing on her right ankle. She denies any head injuries or LOC. No alleviating factors noted. Pt has not tried any medications PTA. She has an allergy to Doxycycline and Penicillins. She is unsure of her last tetanus vaccine. She denies any previous injuries to her right ankle. She also denies any fever, vomiting, numbness/tingling, weakness, open wounds, or any other symptoms.   The history is provided by the patient. No language interpreter was used.    Past Medical History:  Diagnosis Date  . Anxiety   . Colitis   . Depression   . GERD (gastroesophageal reflux disease)   . Panic disorder     Patient Active Problem List   Diagnosis Date Noted  . PANIC DISORDER 05/26/2008  . COLITIS 05/26/2008  . HEARTBURN 05/26/2008  . ANXIETY DEPRESSION 06/12/2007  . ALLERGIC RHINITIS, CHRONIC 06/12/2007    Past Surgical History:  Procedure Laterality Date  . COLONOSCOPY    . ESOPHAGOGASTRODUODENOSCOPY ENDOSCOPY      OB History    No data available       Home Medications    Prior to Admission medications   Medication Sig Start Date End Date Taking? Authorizing Provider  budesonide (ENTOCORT EC) 3 MG 24 hr capsule Take 1-2  tablets by mouth daily 08/18/14   Lafayette Dragon, MD  mesalamine Doristine Johns) 1.2 G EC tablet Take one po BID 06/08/14   [place] Dragon, MD  ranitidine (ZANTAC) 150 MG tablet Take 150 mg by mouth 2 (two) times daily. Pt takes OTC brand    Historical Provider, MD  UNABLE TO FIND Pt gets allergy shots once a week    Historical Provider, MD    Family History Family History  Problem Relation Age of Onset  . Breast cancer Mother   . Ulcerative colitis Mother   . Heart disease Mother   . Esophagitis Mother   . Colon cancer Maternal Grandmother   . Heart disease      grandfather  . Heart disease      uncle  . Colon polyps Neg Hx   . Diabetes Neg Hx   . Esophageal cancer Neg Hx   . Kidney disease Neg Hx   . Gallbladder disease Neg Hx     Social History Social History  Substance Use Topics  . Smoking status: Former Research scientist (life sciences)  . Smokeless tobacco: Never Used  . Alcohol use 0.0 oz/week     Comment: Occassionally     Allergies   Doxycycline and Penicillins   Review of Systems Review of Systems  Constitutional: Negative for fever.  Gastrointestinal: Negative for vomiting.  Musculoskeletal: Positive for arthralgias and joint swelling.  Skin: Negative for wound.  Neurological: Negative for weakness and numbness.  All other systems reviewed and are negative.    Physical Exam Updated Vital Signs BP 145/85 (BP Location: Right Arm)   Pulse 96   Temp 98 F (36.7 C) (Oral)   Resp 18   Ht 5\' 4"  (1.626 m)   Wt 127 lb (57.6 kg)   SpO2 98%   BMI 21.80 kg/m   Physical Exam  Constitutional: She is oriented to person, place, and time. She appears well-developed and well-nourished. No distress.  HENT:  Head: Normocephalic and atraumatic.  Eyes: Conjunctivae and EOM are normal.  Neck: Neck supple. No tracheal deviation present.  Cardiovascular: Normal rate.   Pulmonary/Chest: Effort normal. No respiratory distress.  Musculoskeletal: Normal range of motion.  Pain to lateral aspect of  left ankle and across the top of forefoot; abrasion to top of forefoot.  Neurological: She is alert and oriented to person, place, and time.  Skin: Skin is warm and dry.  Psychiatric: She has a normal mood and affect. Her behavior is normal.  Nursing note and vitals reviewed.    ED Treatments / Results  DIAGNOSTIC STUDIES: Oxygen Saturation is 98% on RA, normal by my interpretation.    COORDINATION OF CARE: 10:42 AM- Pt advised of plan for treatment and pt agrees. Pt will receive right ankle x-ray for further evaluation.   11:45 AM - Pt informed of her x-ray results. She will recieve a Tdap booster here. She will also receive ankle splint and crutches. Will provide resources for Ortho follow up.    Labs (all labs ordered are listed, but only abnormal results are displayed) Labs Reviewed - No data to display  EKG  EKG Interpretation None       Radiology No results found.  Procedures Procedures (including critical care time)  Medications Ordered in ED Medications - No data to display   Initial Impression / Assessment and Plan / ED Course  Etta Quill, NP has reviewed the triage vital signs and the nursing notes.  Pertinent labs & imaging results that were available during my care of the patient were reviewed by me and considered in my medical decision making (see chart for details).  Clinical Course      Patient with right ankle pain. Presentation concerning for sprain. X-rays reviewed by me revealed no bony abnormality, dislocation, or fracture. Patient is neurovascularly intact distally and able to move right ankle although states it is painful. Patient given ankle splint and crutches in the ED. Discussed RICE and pain medication. Instructed patient to follow up with their primary care provider or orthopedics within 2-3 days to have right ankle reevaluated. Discussed strict return precautions to the ED. Patient appears reliable and expressed understanding to the  discharge instructions.    Final Clinical Impressions(s) / ED Diagnoses   Final diagnoses:  Acute right ankle pain    New Prescriptions Discharge Medication List as of 12/24/2015 12:08 PM     I personally performed the services described in this documentation, which was scribed in my presence. The recorded information has been reviewed and is accurate.   Etta Quill, NP 12/24/15 [place], MD 12/25/15 [phone]

## 2016-04-17 ENCOUNTER — Encounter: Payer: Self-pay | Admitting: Gastroenterology

## 2016-06-09 ENCOUNTER — Other Ambulatory Visit: Payer: Self-pay | Admitting: Geriatric Medicine

## 2016-06-09 DIAGNOSIS — Z1231 Encounter for screening mammogram for malignant neoplasm of breast: Secondary | ICD-10-CM

## 2016-06-10 ENCOUNTER — Ambulatory Visit
Admission: RE | Admit: 2016-06-10 | Discharge: 2016-06-10 | Disposition: A | Discharge status: Home / Self Care | Payer: BC Managed Care – PPO | Source: Ambulatory Visit | Attending: Geriatric Medicine | Admitting: Geriatric Medicine

## 2016-06-10 DIAGNOSIS — Z1231 Encounter for screening mammogram for malignant neoplasm of breast: Secondary | ICD-10-CM

## 2017-10-21 ENCOUNTER — Other Ambulatory Visit: Payer: Self-pay | Admitting: Geriatric Medicine

## 2017-10-21 DIAGNOSIS — Z1231 Encounter for screening mammogram for malignant neoplasm of breast: Secondary | ICD-10-CM

## 2017-11-20 ENCOUNTER — Ambulatory Visit
Admission: RE | Admit: 2017-11-20 | Discharge: 2017-11-20 | Disposition: A | Discharge status: Home / Self Care | Payer: BC Managed Care – PPO | Source: Ambulatory Visit | Attending: Geriatric Medicine | Admitting: Geriatric Medicine

## 2017-11-20 DIAGNOSIS — Z1231 Encounter for screening mammogram for malignant neoplasm of breast: Secondary | ICD-10-CM

## 2017-12-21 ENCOUNTER — Encounter: Payer: Self-pay | Admitting: Gastroenterology

## 2018-01-11 ENCOUNTER — Encounter: Payer: Self-pay | Admitting: Gastroenterology

## 2018-01-11 ENCOUNTER — Other Ambulatory Visit: Payer: Self-pay

## 2018-01-11 ENCOUNTER — Ambulatory Visit (AMBULATORY_SURGERY_CENTER): Payer: Self-pay | Admitting: *Deleted

## 2018-01-11 VITALS — Ht 64.0 in | Wt 120.6 lb

## 2018-01-11 DIAGNOSIS — Z1211 Encounter for screening for malignant neoplasm of colon: Secondary | ICD-10-CM

## 2018-01-11 MED ORDER — SUPREP BOWEL PREP KIT 17.5-3.13-1.6 GM/177ML PO SOLN: 1.0000 | Freq: Once | ORAL | 0 refills | Status: AC

## 2018-01-11 NOTE — Progress Notes (Signed)
No egg or soy allergy known to patient  No issues with past sedation with any surgeries  or procedures, no intubation problems  No diet pills per patient No home 02 use per patient  No blood thinners per patient  Pt denies issues with constipation  No A fib or A flutter  EMMI video sent to pt's e mail  Patient requesting office visit to discuss increasing frequency of diarrhea, hx of colitis

## 2018-01-15 ENCOUNTER — Ambulatory Visit: Payer: BC Managed Care – PPO | Admitting: Nurse Practitioner

## 2018-01-15 ENCOUNTER — Encounter: Payer: Self-pay | Admitting: Nurse Practitioner

## 2018-01-15 VITALS — BP 134/86 | HR 80 | Ht 63.0 in | Wt 118.0 lb

## 2018-01-15 DIAGNOSIS — R197 Diarrhea, unspecified: Secondary | ICD-10-CM | POA: Diagnosis not present

## 2018-01-15 DIAGNOSIS — K52839 Microscopic colitis, unspecified: Secondary | ICD-10-CM

## 2018-01-15 MED ORDER — DICYCLOMINE HCL 10 MG PO CAPS: 10.0000 mg | ORAL_CAPSULE | Freq: Two times a day (BID) | ORAL | 2 refills | Status: DC

## 2018-01-15 NOTE — Progress Notes (Signed)
       Chief Complaint:  diarrhea     IMPRESSION and PLAN:     62 yo female with chronic intermittent crampy diarrhea. She has a history of lymphocytic colitis (2008). Good response to mesalamine initially but it lost response.. For relapse she was treated with Budesonide in 2016 without any noticeable improvement. Now with worsening diarrhea over last 3-4 months.  -symptoms sound a little atypical for lymphocytic colitis. 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 She is already scheduled for screening colonoscopy on 01/25/18 so we can further evaluate at that time.  -for now will try bentyl BID prn cramps.     HPI:     Patient is a 62 yo female previously followed by Dr. Olevia Jarvis for a hx of GERD / reflux esophagitis and remote hx of lymphocytic colitis (2008). Initially lymphotic colitis was treated with Mesalamine which helped then lost effect. In 2016 patient represented with diarrhea and was given a course of Entocort which wasn't so helpful. Since then she has continued to live with diarrhea and has good and bad days. Over the last 3-4 months the diarrhea has gotten worse. There is no blood in her stool. No fevers. No recent antibiotics.  Patient put herself on a bland diet a month ago and that did help but still may have up to 3 loose BMs a day. with transient relief. She stopped fiber, vegetables. She is intolerant to dairy. She has frequent abdominal cramps and wants something to help them. She is scheduled for a screening colonoscopy with Dr. Silverio Decamp on 01/25/18   Review of systems:     No chest pain, no SOB, no fevers, no urinary sx   Past Medical History:  Diagnosis Date  . Allergy   . Anxiety   . Arthritis   . Depression   . GERD (gastroesophageal reflux disease)   . Lymphocytic colitis   . Panic disorder     Patient's surgical history, family medical history, social history, medications and allergies were all reviewed in Epic   Creatinine clearance cannot be calculated (No successful lab value  found.)  Current Outpatient Medications  Medication Sig Dispense Refill  . alum & mag hydroxide-simeth (MAALOX/MYLANTA) 200-200-20 MG/5ML suspension Take by mouth.    . cetirizine (ZYRTEC) 10 MG tablet Take by mouth.    . famotidine (PEPCID) 20 MG tablet Take 20 mg by mouth 2 (two) times daily.    . fluocinonide gel (LIDEX) 0.05 % Apply topically.    Marland Kitchen ibuprofen (ADVIL,MOTRIN) 200 MG tablet Take by mouth.    Marland Kitchen UNABLE TO FIND Pt gets allergy shots once a week     No current facility-administered medications for this visit.     Physical Exam:     BP 134/86 (BP Location: Left Arm, Patient Position: Sitting, Cuff Size: Normal)   Pulse 80   Ht 5\' 3"  (1.6 m) Comment: height measured without shoes  Wt 118 lb (53.5 kg)   BMI 20.90 kg/m   GENERAL:  Pleasant female in NAD PSYCH: : Cooperative, normal affect EENT:  conjunctiva pink, mucous membranes moist, neck supple without masses CARDIAC:  RRR,  No peripheral edema PULM: Normal respiratory effort, lungs CTA bilaterally, no wheezing ABDOMEN:  Nondistended, soft, nontender. No obvious masses, no hepatomegaly,  normal bowel sounds SKIN:  turgor, no lesions seen Musculoskeletal:  Normal muscle tone, normal strength NEURO: Alert and oriented x 3, no focal neurologic deficits   Vicki Jarvis , NP 01/15/2018, 10:11 AM

## 2018-01-15 NOTE — Patient Instructions (Signed)
If you are age 61 or older, your body mass index should be between 23-30. Your Body mass index is 20.9 kg/m. If this is out of the aforementioned range listed, please consider follow up with your Primary Care Provider.  If you are age 27 or younger, your body mass index should be between 19-25. Your Body mass index is 20.9 kg/m. If this is out of the aformentioned range listed, please consider follow up with your Primary Care Provider.   We have sent the following medications to your pharmacy for you to pick up at your convenience: Bentyl 10 mg  Follow up as needed.  Thank you for choosing me and [facility].   [name], NP

## 2018-01-18 ENCOUNTER — Encounter: Payer: Self-pay | Admitting: Nurse Practitioner

## 2018-01-20 HISTORY — PX: COLONOSCOPY: SHX174

## 2018-01-25 ENCOUNTER — Ambulatory Visit (AMBULATORY_SURGERY_CENTER): Payer: BC Managed Care – PPO | Admitting: Gastroenterology

## 2018-01-25 ENCOUNTER — Encounter: Payer: Self-pay | Admitting: Gastroenterology

## 2018-01-25 VITALS — BP 120/64 | HR 62 | Temp 96.4°F | Resp 16 | Ht 63.0 in | Wt 118.0 lb

## 2018-01-25 DIAGNOSIS — K52839 Microscopic colitis, unspecified: Secondary | ICD-10-CM

## 2018-01-25 DIAGNOSIS — R197 Diarrhea, unspecified: Secondary | ICD-10-CM | POA: Diagnosis present

## 2018-01-25 DIAGNOSIS — D120 Benign neoplasm of cecum: Secondary | ICD-10-CM

## 2018-01-25 MED ORDER — SODIUM CHLORIDE 0.9 % IV SOLN: 500.0000 mL | Freq: Once | INTRAVENOUS | Status: DC

## 2018-01-25 NOTE — Op Note (Signed)
[facility] Patient Name: Vicki Jarvis Procedure Date: 01/25/2018 8:36 AM MRN: [id number] Endoscopist: Mauri Pole , MD Age: 63 Referring MD:  Date of Birth: October 25, 1955 Gender: Female Account #: [id number] Procedure:                Colonoscopy Indications:              Clinically significant diarrhea of unexplained                            origin Medicines:                Monitored Anesthesia Care Procedure:                Pre-Anesthesia Assessment:                           - Prior to the procedure, a History and Physical                            was performed, and patient medications and                            allergies were reviewed. The patient's tolerance of                            previous anesthesia was also reviewed. The risks                            and benefits of the procedure and the sedation                            options and risks were discussed with the patient.                            All questions were answered, and informed consent                            was obtained. Prior Anticoagulants: The patient has                            taken no previous anticoagulant or antiplatelet                            agents. ASA Grade Assessment: II - A patient with                            mild systemic disease. After reviewing the risks                            and benefits, the patient was deemed in                            satisfactory condition to undergo the procedure.  After obtaining informed consent, the colonoscope                            was passed under direct vision. Throughout the                            procedure, the patient's blood pressure, pulse, and                            oxygen saturations were monitored continuously. The                            Model PCF-[id number]) scope was introduced                            through the anus and advanced to the the terminal                           ileum, with identification of the appendiceal                            orifice and IC valve. The colonoscopy was performed                            without difficulty. The patient tolerated the                            procedure well. The quality of the bowel                            preparation was fair. The terminal ileum, ileocecal                            valve, appendiceal orifice, and rectum were                            photographed. Scope In: 8:51:22 AM Scope Out: 9:18:34 AM Scope Withdrawal Time: 0 hours 14 minutes 44 seconds  Total Procedure Duration: 0 hours 27 minutes 12 seconds  Findings:                 The perianal and digital rectal examinations were                            normal.                           A 5 mm polyp was found in the cecum. The polyp was                            sessile. The polyp was removed with a cold snare.                            Resection and retrieval were complete.  Normal mucosa was found in the entire colon except                            mild patchy erythema in the rectum. Biopsies for                            histology were taken with a cold forceps from the                            right colon and left colon for evaluation of                            microscopic colitis.                           Non-bleeding internal hemorrhoids were found during                            retroflexion. The hemorrhoids were small.                           The terminal ileum appeared normal. Complications:            No immediate complications. Impression:               - Preparation of the colon was fair.                           - One 5 mm polyp in the cecum, removed with a cold                            snare. Resected and retrieved.                           - Normal mucosa in the entire examined colon except                            mild patchy erythema in the rectum.  Biopsied.                           - Non-bleeding internal hemorrhoids. Recommendation:           - Patient has a contact number available for                            emergencies. The signs and symptoms of potential                            delayed complications were discussed with the                            patient. Return to normal activities tomorrow.                            Written discharge instructions were provided to the  patient.                           - Resume previous diet.                           - Continue present medications.                           - Await pathology results.                           - No aspirin, ibuprofen, naproxen, or other                            non-steroidal anti-inflammatory drugs.                           - Repeat colonoscopy in 5 years for surveillance                            based on pathology results.                           - Return to GI clinic at the next available                            appointment. Mauri Pole, MD 01/25/2018 9:25:57 AM This report has been signed electronically.

## 2018-01-25 NOTE — Progress Notes (Signed)
Called to room to assist during endoscopic procedure.  Patient ID and intended procedure confirmed with present staff. Received instructions for my participation in the procedure from the performing physician.  

## 2018-01-25 NOTE — Progress Notes (Signed)
Report to PACU, RN, vss, BBS= Clear.  

## 2018-01-25 NOTE — Progress Notes (Signed)
Pt's states no medical or surgical changes since previsit or office visit. 

## 2018-01-25 NOTE — Patient Instructions (Signed)
YOU HAD AN ENDOSCOPIC PROCEDURE TODAY AT THE [facility]:   Refer to the procedure report that was given to you for any specific questions about what was found during the examination.  If the procedure report does not answer your questions, please call your gastroenterologist to clarify.  If you requested that your care partner not be given the details of your procedure findings, then the procedure report has been included in a sealed envelope for you to review at your convenience later.  YOU SHOULD EXPECT: Some feelings of bloating in the abdomen. Passage of more gas than usual.  Walking can help get rid of the air that was put into your GI tract during the procedure and reduce the bloating. If you had a lower endoscopy (such as a colonoscopy or flexible sigmoidoscopy) you may notice spotting of blood in your stool or on the toilet paper. If you underwent a bowel prep for your procedure, you may not have a normal bowel movement for a few days.  Please Note:  You might notice some irritation and congestion in your nose or some drainage.  This is from the oxygen used during your procedure.  There is no need for concern and it should clear up in a day or so.  SYMPTOMS TO REPORT IMMEDIATELY:   Following lower endoscopy (colonoscopy or flexible sigmoidoscopy):  Excessive amounts of blood in the stool  Significant tenderness or worsening of abdominal pains  Swelling of the abdomen that is new, acute  Fever of 100F or higher  For urgent or emergent issues, a gastroenterologist can be reached at any hour by calling [phone].   DIET:  We do recommend a small meal at first, but then you may proceed to your regular diet.  Drink plenty of fluids but you should avoid alcoholic beverages for 24 hours.  ACTIVITY:  You should plan to take it easy for the rest of today and you should NOT DRIVE or use heavy machinery until tomorrow (because of the sedation medicines used during the test).     FOLLOW UP: Our staff will call the number listed on your records the next business day following your procedure to check on you and address any questions or concerns that you may have regarding the information given to you following your procedure. If we do not reach you, we will leave a message.  However, if you are feeling well and you are not experiencing any problems, there is no need to return our call.  We will assume that you have returned to your regular daily activities without incident.  If any biopsies were taken you will be contacted by phone or by letter within the next 1-3 weeks.  Please call us at [phone] if you have not heard about the biopsies in 3 weeks.    SIGNATURES/CONFIDENTIALITY: You and/or your care partner have signed paperwork which will be entered into your electronic medical record.  These signatures attest to the fact that that the information above on your After Visit Summary has been reviewed and is understood.  Full responsibility of the confidentiality of this discharge information lies with you and/or your care-partner. 

## 2018-01-26 ENCOUNTER — Telehealth: Payer: Self-pay | Admitting: *Deleted

## 2018-01-26 ENCOUNTER — Encounter: Payer: Self-pay | Admitting: *Deleted

## 2018-01-26 NOTE — Telephone Encounter (Signed)
  Follow up Call-  Call back number 01/25/2018  Post procedure Call Back phone  # [phone]  Permission to leave phone message Yes  Some recent data might be hidden     Patient questions:  Do you have a fever, pain , or abdominal swelling? Yes.   Pain Score  3 *  Have you tolerated food without any problems? Yes.    Have you been able to return to your normal activities? Yes.    Do you have any questions about your discharge instructions: Diet   Yes.   Medications  No. Follow up visit  No.  Do you have questions or concerns about your Care? No.  Actions: * If pain score is 4 or above: No action needed, pain <4.

## 2018-01-26 NOTE — Telephone Encounter (Signed)
error 

## 2018-01-28 NOTE — Progress Notes (Signed)
Reviewed and agree with documentation and assessment and plan. K. Veena Reyonna Haack , MD   

## 2018-02-03 ENCOUNTER — Encounter: Payer: Self-pay | Admitting: Gastroenterology

## 2018-02-09 ENCOUNTER — Telehealth: Payer: Self-pay | Admitting: Gastroenterology

## 2018-02-09 NOTE — Telephone Encounter (Signed)
Pt called in wanting to discuss the lab results that she just recv.

## 2018-02-10 ENCOUNTER — Other Ambulatory Visit: Payer: Self-pay

## 2018-02-10 MED ORDER — RIFAXIMIN 550 MG PO TABS: 550.0000 mg | ORAL_TABLET | Freq: Three times a day (TID) | ORAL | 0 refills | Status: DC

## 2018-02-10 NOTE — Telephone Encounter (Signed)
Reports limited response to Bentyl BID. First few days she had improvement from watery stools to loose stools and fewer times. She has continued the Bentyl. She reports she is again having watery stools in excess of 4 daily. Rare soft formed stool.  Also cramping in her abdomen. No fevers, nausea or bloody stools. Please advise.

## 2018-02-10 NOTE — Telephone Encounter (Signed)
Patient is advised. She will check with the pharmacy about coverage on the Xifaxan. Rx to [facility]. She will call back if the medication is not affordable.

## 2018-02-10 NOTE — Telephone Encounter (Signed)
Colon biopsies negative for microscopic colitis.  Irritable bowel syndrome predominant diarrhea Please send prescription for rifaximin 550 mg 3 times daily for 2 weeks.  Schedule follow-up visit next available appointment either with me or extender.

## 2018-02-22 ENCOUNTER — Telehealth: Payer: Self-pay | Admitting: Gastroenterology

## 2018-02-22 NOTE — Telephone Encounter (Signed)
Complete the course of Xifaxan.  Avoid lactose, artificial sweeteners, foods with high sugar content and soda.  Please schedule an office visit with extender next available.  Thank you

## 2018-02-22 NOTE — Telephone Encounter (Signed)
Patient is continuing to have excess of 4 stools a day. She has had "5 or 6" bowel movements today. Stomach cramps. She is Soil scientist. Please advise.

## 2018-02-22 NOTE — Telephone Encounter (Signed)
Patient agrees to this plan of care. Appointment tomorrow at 2:00 pm

## 2018-02-23 ENCOUNTER — Ambulatory Visit: Payer: BC Managed Care – PPO | Admitting: Physician Assistant

## 2018-02-23 ENCOUNTER — Other Ambulatory Visit: Payer: BC Managed Care – PPO

## 2018-02-23 ENCOUNTER — Encounter: Payer: Self-pay | Admitting: Physician Assistant

## 2018-02-23 VITALS — BP 120/86 | HR 81 | Ht 63.5 in | Wt 117.0 lb

## 2018-02-23 DIAGNOSIS — R197 Diarrhea, unspecified: Secondary | ICD-10-CM | POA: Diagnosis not present

## 2018-02-23 MED ORDER — DICYCLOMINE HCL 10 MG PO CAPS: 10.0000 mg | ORAL_CAPSULE | Freq: Three times a day (TID) | ORAL | 2 refills | Status: DC

## 2018-02-23 NOTE — Progress Notes (Signed)
Chief Complaint: Diarrhea  HPI:    Vicki Jarvis is a 63 year old Caucasian female, known to Dr. Silverio Decamp, who presents to clinic today with a complaint of diarrhea.     06/13/2014 GI path panel, ESR, celiac panel and fecal lactoferrin were normal.    01/15/2018 office visit with Nevin Bloodgood for diarrhea.  Described a history of lymphocytic colitis (2008), good response to mesalamine initially but lost response.  For relapse she was treated budesonide in 2016 without any noticeable improvement, worsening diarrhea for the past 3 to 4 months.  Given twice daily Bentyl for cramps.    01/25/2018 colonoscopy with Dr. Silverio Decamp for diarrhea.  This showed fair preparation of the colon, one 5 mm polyp in the cecum removed and normal mucosa in the entire examined colon except mild patchy erythema in the rectum and nonbleeding internal hemorrhoids. Pathology showed tubular adenoma and otherwise benign colonic mucosa with no signs of microscopic colitis.  Repeat colonoscopy recommended in 5 years.    02/10/2018 phone call with patient.  Continued with loose stools upwards of 4 times a day.  Prescribed Xifaxan 550 mg 3 times daily x2 weeks.    02/21/2018 patient called the office and had completed Xifaxan.  She was told to avoid lactose, artificial sweeteners and foods with high sugar content and soda.    Today, the patient resents clinic and explains that she is still having 10-12 loose watery stools per day with cramping all throughout the day and night which keeps her awake.  Has one mor day of Xifaxan. Taking Dicyclomine 10 mg twice daily does help some.  She just restarted this the other day.  Tells me that prior to all of her diarrhea she had trouble with constipation so taking anything that stops her up makes her somewhat nervous.  Denies blood in her stool.    Denies fever, chills, weight loss, anorexia, nausea or vomiting.    Past Medical History:  Diagnosis Date  . Allergy   . Anxiety   . Arthritis   . Depression    . GERD (gastroesophageal reflux disease)   . Lymphocytic colitis   . Panic disorder     Past Surgical History:  Procedure Laterality Date  . COLONOSCOPY    . ESOPHAGOGASTRODUODENOSCOPY ENDOSCOPY    . thumb surgery Right     Current Outpatient Medications  Medication Sig Dispense Refill  . alum & mag hydroxide-simeth (MAALOX/MYLANTA) 200-200-20 MG/5ML suspension Take by mouth.    . cetirizine (ZYRTEC) 10 MG tablet Take by mouth.    . dicyclomine (BENTYL) 10 MG capsule Take 1 capsule (10 mg total) by mouth 2 (two) times daily. 60 capsule 2  . famotidine (PEPCID) 20 MG tablet Take 20 mg by mouth 2 (two) times daily.    . fluocinonide gel (LIDEX) 0.05 % Apply topically.    Marland Kitchen ibuprofen (ADVIL,MOTRIN) 200 MG tablet Take by mouth.    . rifaximin (XIFAXAN) 550 MG TABS tablet Take 1 tablet (550 mg total) by mouth 3 (three) times daily for 14 days. 42 tablet 0  . UNABLE TO FIND Pt gets allergy shots once a week     No current facility-administered medications for this visit.     Allergies as of 02/23/2018 - Review Complete 01/25/2018  Allergen Reaction Noted  . Doxycycline  05/26/2008  . Penicillins  05/26/2008    Family History  Problem Relation Age of Onset  . Breast cancer Mother   . Ulcerative colitis Mother   . Heart disease  Mother   . Esophagitis Mother   . Colon cancer Maternal Grandmother   . Heart disease Other        grandfather  . Heart disease Other        uncle  . Irritable bowel syndrome Sister   . Colon polyps Neg Hx   . Diabetes Neg Hx   . Esophageal cancer Neg Hx   . Kidney disease Neg Hx   . Gallbladder disease Neg Hx   . Stomach cancer Neg Hx   . Rectal cancer Neg Hx     Social History   Socioeconomic History  . Marital status: Married    Spouse name: Not on file  . Number of children: 0  . Years of education: Not on file  . Highest education level: Not on file  Occupational History  . Occupation: Secretary/retired  Social Needs  . Financial  resource strain: Not on file  . Food insecurity:    Worry: Not on file    Inability: Not on file  . Transportation needs:    Medical: Not on file    Non-medical: Not on file  Tobacco Use  . Smoking status: Former Smoker    Types: Cigarettes    Last attempt to quit: 01/15/1981    Years since quitting: 37.1  . Smokeless tobacco: Never Used  Substance and Sexual Activity  . Alcohol use: Yes    Alcohol/week: 0.0 standard drinks    Comment: Occassionally  . Drug use: No  . Sexual activity: Not on file  Lifestyle  . Physical activity:    Days per week: Not on file    Minutes per session: Not on file  . Stress: Not on file  Relationships  . Social connections:    Talks on phone: Not on file    Gets together: Not on file    Attends religious service: Not on file    Active member of club or organization: Not on file    Attends meetings of clubs or organizations: Not on file    Relationship status: Not on file  . Intimate partner violence:    Fear of current or ex partner: Not on file    Emotionally abused: Not on file    Physically abused: Not on file    Forced sexual activity: Not on file  Other Topics Concern  . Not on file  Social History Narrative  . Not on file    Review of Systems:    Constitutional: No weight loss, fever or chills Cardiovascular: No chest pain   Respiratory: No SOB  Gastrointestinal: See HPI and otherwise negative   Physical Exam:  Vital signs: BP 120/86   Pulse 81   Ht 5' 3.5" (1.613 m)   Wt 117 lb (53.1 kg)   BMI 20.40 kg/m   Constitutional:   Pleasant Caucasian female appears to be in NAD, Well developed, Well nourished, alert and cooperative Respiratory: Respirations even and unlabored. Lungs clear to auscultation bilaterally.   No wheezes, crackles, or rhonchi.  Cardiovascular: Normal S1, S2. No MRG. Regular rate and rhythm. No peripheral edema, cyanosis or pallor.  Gastrointestinal:  Soft, nondistended, mild generalized ttp. No rebound  or guarding. Normal bowel sounds. No appreciable masses or hepatomegaly. Psychiatric: Demonstrates good judgement and reason without abnormal affect or behaviors.  No recent labs or imaging.  Assessment: 1.  Diarrhea: Recent colonoscopy with biopsies negative for microscopic colitis, previous diagnosis of collagenous colitis with Dr. Olevia Perches years ago, no recent stool studies,  continues with 10-12 watery stools per day and generalized abdominal cramping; consider infectious cause versus IBS-D  Plan: 1.  Discussed the patient that she can increase her Dicyclomine to 10 mg 4 times daily, 20-30 minutes before meals and at bedtime, prescribed #120 with 1 refill. 2.  Ordered GI pathogen panel, O&P and pancreatic fecal elastase. 3.  Pending results from above, could also discuss Viberzi if IBS-D is suspected. 4.  Did provide the patient with a doctor's note for jury duty which is coming up given her extreme cramping and multiple bowel movements. 5.  Patient to follow in clinic with me or Dr. Silverio Decamp as dictated by stool studies above.  Ellouise Newer, PA-C [facility] 02/23/2018, 1:59 PM  Cc: Lajean Manes, MD

## 2018-02-23 NOTE — Patient Instructions (Signed)
Your provider has requested that you go to the basement level for lab work before leaving today. Press "B" on the elevator. The lab is located at the first door on the left as you exit the elevator.  We have sent the following medications to your pharmacy for you to pick up at your convenience:  Dicyclomine.

## 2018-02-24 ENCOUNTER — Other Ambulatory Visit: Payer: BC Managed Care – PPO

## 2018-02-24 DIAGNOSIS — R197 Diarrhea, unspecified: Secondary | ICD-10-CM

## 2018-02-25 ENCOUNTER — Ambulatory Visit: Payer: BC Managed Care – PPO | Admitting: Physician Assistant

## 2018-02-25 ENCOUNTER — Ambulatory Visit: Payer: BC Managed Care – PPO | Admitting: Nurse Practitioner

## 2018-03-05 ENCOUNTER — Telehealth: Payer: Self-pay | Admitting: Physician Assistant

## 2018-03-05 LAB — GASTROINTESTINAL PATHOGEN PANEL PCR
C. difficile Tox A/B, PCR: NOT DETECTED
CAMPYLOBACTER, PCR: NOT DETECTED
CRYPTOSPORIDIUM, PCR: NOT DETECTED
E coli (ETEC) LT/ST PCR: NOT DETECTED
E coli (STEC) stx1/stx2, PCR: NOT DETECTED
E coli 0157, PCR: NOT DETECTED
Giardia lamblia, PCR: NOT DETECTED
NOROVIRUS, PCR: NOT DETECTED
ROTAVIRUS, PCR: NOT DETECTED
SALMONELLA, PCR: NOT DETECTED
Shigella, PCR: NOT DETECTED

## 2018-03-05 LAB — OVA AND PARASITE EXAMINATION
CONCENTRATE RESULT:: NONE SEEN
SPECIMEN QUALITY:: ADEQUATE
TRICHROME RESULT:: NONE SEEN
VKL: 154401

## 2018-03-05 LAB — PANCREATIC ELASTASE, FECAL: Pancreatic Elastase-1, Stool: 140 mcg/g — ABNORMAL LOW

## 2018-03-05 NOTE — Telephone Encounter (Signed)
The pt was advised that the path panel has not returned and we will call as soon as available. The pt has been advised of the information and verbalized understanding.

## 2018-03-05 NOTE — Telephone Encounter (Signed)
Pt called inquiring about results of stool samples.

## 2018-03-08 ENCOUNTER — Other Ambulatory Visit: Payer: Self-pay

## 2018-03-08 ENCOUNTER — Telehealth: Payer: Self-pay | Admitting: Gastroenterology

## 2018-03-08 MED ORDER — PANCRELIPASE (LIP-PROT-AMYL) 36000-114000 UNITS PO CPEP: ORAL_CAPSULE | ORAL | 3 refills | Status: DC

## 2018-03-08 NOTE — Telephone Encounter (Signed)
Scheduled appointment with [name] on 03/17/2018 at 1:45pm  Patient last seen [facility] in the office

## 2018-03-08 NOTE — Telephone Encounter (Signed)
Pt will like a call back she has  questions about the prescription for her pancreas that she will be placed on she has some additional questions before she starts taking it

## 2018-03-08 NOTE — Telephone Encounter (Signed)
Returned patients call she wanted to make an appointment with the PA to discuss her new dx of pancreatic insuffiencey and the new start of creon, she has numerous questions and concerns

## 2018-03-15 NOTE — Progress Notes (Signed)
Reviewed and agree with documentation and assessment and plan. K. Veena Raydon Chappuis , MD   

## 2018-03-17 ENCOUNTER — Encounter: Payer: Self-pay | Admitting: Physician Assistant

## 2018-03-17 ENCOUNTER — Ambulatory Visit: Payer: BC Managed Care – PPO | Admitting: Physician Assistant

## 2018-03-17 VITALS — BP 102/66 | HR 90 | Ht 64.0 in | Wt 114.0 lb

## 2018-03-17 DIAGNOSIS — R197 Diarrhea, unspecified: Secondary | ICD-10-CM | POA: Diagnosis not present

## 2018-03-17 NOTE — Patient Instructions (Addendum)
If you are age 62 or older, your body mass index should be between 23-30. Your Body mass index is 19.57 kg/m. If this is out of the aforementioned range listed, please consider follow up with your Primary Care Provider.  If you are age 73 or younger, your body mass index should be between 19-25. Your Body mass index is 19.57 kg/m. If this is out of the aformentioned range listed, please consider follow up with your Primary Care Provider.   Continue Creon and dicyclomine for now.  Please call next month to schedule an appointment with Dr. Silverio Decamp in April: [phone], Option #2  Thank you for entrusting me with your care and for choosing [facility], [place], [place]

## 2018-03-17 NOTE — Progress Notes (Signed)
Chief Complaint: Follow-up diarrhea  HPI:    Vicki Jarvis is a 63 year old Caucasian female, known to Dr. Silverio Decamp, who presents clinic today for follow-up of diarrhea.    02/23/2018 office visit with me to discuss diarrhea.  At that time was having 10-12 loose watery stools per day with cramping all throughout the day and night which kept her awake.  Was currently taking Xifaxan for possible SIBO but only had 1 more day left.  Was also taking dicyclomine 10 mg twice daily which helps some.  Did describe some constipation prior to all of her trouble with diarrhea so taking anything that made her stop completely made her somewhat nervous.  Previous colonoscopy 01/25/2018 Dr. Silverio Decamp for diarrhea with a 5 mm polyp and mild patchy erythema in the rectum with nonbleeding internal hemorrhoids.  Biopsies negative for microscopic colitis.  Repeat recommended in 5 years due to tubular adenoma.  Previously had been seen in clinic 01/15/2018 by Nevin Bloodgood for diarrhea, history of lymphocytic colitis in 2008 with initial response to mesalamine but had lost response.  For relapse she been treated budesonide in 2016 without any improvement.  Stool studies including GI pathogen panel, O&P and pancreatic fecal elastase.    Briefly discussed Viberzi if IBS D was suspected.  Also increased Dicyclomine to 4 times daily.    02/24/2018 GI path panel returned negative, O&P negative, pancreatic fecal elastase came back low at 140 showing moderate pancreatic insufficiency.  It was suggested she try Creon 36,000 units 2 with a meal and 1 with a snack see if this help with diarrhea.    Today, the patient presents clinic and explains that over the past week and a half she has been using her Creon 36,000 units 2 with a meal and 1 with a snack as well as Dicyclomine 4 times a day and feels maybe 10% better.  Definitely does not feel as "run down" as before. Noting that now she may have days of only 6 loose stools compared to her regular 10.   She continues with occasional cramping but typically if she uses her Dicyclomine as prescribed QID, this works.  Her stools are "not quite as watery".  Does have many questions in regards to pancreatic insufficiency today.    Denies fever, chills, weight loss, anorexia, nausea, vomiting or symptoms that awaken her from sleep.  Past Medical History:  Diagnosis Date  . Allergy   . Anxiety   . Arthritis   . Depression   . GERD (gastroesophageal reflux disease)   . Lymphocytic colitis   . Panic disorder     Past Surgical History:  Procedure Laterality Date  . COLONOSCOPY    . ESOPHAGOGASTRODUODENOSCOPY ENDOSCOPY    . thumb surgery Right     Current Outpatient Medications  Medication Sig Dispense Refill  . alum & mag hydroxide-simeth (MAALOX/MYLANTA) 200-200-20 MG/5ML suspension Take by mouth.    . dicyclomine (BENTYL) 10 MG capsule Take 1 capsule (10 mg total) by mouth 4 (four) times daily -  before meals and at bedtime. 60 capsule 2  . famotidine (PEPCID) 20 MG tablet Take 20 mg by mouth 2 (two) times daily.    . fluocinonide gel (LIDEX) 0.05 % Apply topically.    Marland Kitchen ibuprofen (ADVIL,MOTRIN) 200 MG tablet Take by mouth.    . lipase/protease/amylase (CREON) 36000 UNITS CPEP capsule 2 capsules during each meal and 1 capsule during each snack. 250 capsule 3  . UNABLE TO FIND Pt gets allergy shots once a  week     No current facility-administered medications for this visit.     Allergies as of 03/17/2018 - Review Complete 02/23/2018  Allergen Reaction Noted  . Doxycycline  05/26/2008  . Penicillins  05/26/2008    Family History  Problem Relation Age of Onset  . Breast cancer Mother   . Ulcerative colitis Mother   . Heart disease Mother   . Esophagitis Mother   . Colon cancer Maternal Grandmother   . Heart disease Other        grandfather  . Heart disease Other        uncle  . Irritable bowel syndrome Sister   . Colon polyps Neg Hx   . Diabetes Neg Hx   . Esophageal cancer  Neg Hx   . Kidney disease Neg Hx   . Gallbladder disease Neg Hx   . Stomach cancer Neg Hx   . Rectal cancer Neg Hx     Social History   Socioeconomic History  . Marital status: Married    Spouse name: Not on file  . Number of children: 0  . Years of education: Not on file  . Highest education level: Not on file  Occupational History  . Occupation: Secretary/retired  Social Needs  . Financial resource strain: Not on file  . Food insecurity:    Worry: Not on file    Inability: Not on file  . Transportation needs:    Medical: Not on file    Non-medical: Not on file  Tobacco Use  . Smoking status: Former Smoker    Types: Cigarettes    Last attempt to quit: 01/15/1981    Years since quitting: 37.1  . Smokeless tobacco: Never Used  Substance and Sexual Activity  . Alcohol use: Yes    Alcohol/week: 0.0 standard drinks    Comment: Occassionally  . Drug use: No  . Sexual activity: Not on file  Lifestyle  . Physical activity:    Days per week: Not on file    Minutes per session: Not on file  . Stress: Not on file  Relationships  . Social connections:    Talks on phone: Not on file    Gets together: Not on file    Attends religious service: Not on file    Active member of club or organization: Not on file    Attends meetings of clubs or organizations: Not on file    Relationship status: Not on file  . Intimate partner violence:    Fear of current or ex partner: Not on file    Emotionally abused: Not on file    Physically abused: Not on file    Forced sexual activity: Not on file  Other Topics Concern  . Not on file  Social History Narrative  . Not on file    Review of Systems:    Constitutional: No weight loss, fever or chills Cardiovascular: No chest pain Respiratory: No SOB  Gastrointestinal: See HPI and otherwise negative   Physical Exam:  Vital signs: BP 102/66   Pulse 90   Ht 5\' 4"  (1.626 m)   Wt 114 lb (51.7 kg)   BMI 19.57 kg/m   Constitutional:    Pleasant Caucasian female appears to be in NAD, Well developed, Well nourished, alert and cooperative Respiratory: Respirations even and unlabored. Lungs clear to auscultation bilaterally.   No wheezes, crackles, or rhonchi.  Cardiovascular: Normal S1, S2. No MRG. Regular rate and rhythm. No peripheral edema, cyanosis or pallor.  Gastrointestinal:  Soft, nondistended, nontender. No rebound or guarding. Normal bowel sounds. No appreciable masses or hepatomegaly. Psychiatric:  Demonstrates good judgement and reason without abnormal affect or behaviors.  Assessment: 1.  Chronic diarrhea: Distant diagnosis of microscopic colitis, previously treated with Mesalamine, this stopped working, budesonide tried in 2016 which did not help, recent colonoscopy in January of this year for diarrhea with biopsies negative for bronchoscopic colitis, pancreatic fecal elastase returned with moderate pancreatic insufficiency, patient started on Creon supplementation as well as dicyclomine 4 times daily for suspected IBS, some improvement recently  Plan: 1.  Patient asked questions in regards to her pancreatic insufficiency.  Described that this was moderate per her fecal testing.  Explained that there are no real indications for further testing i.e. imaging studies given this 1 lab abnormality, but will discuss further with Dr. Silverio Decamp. 2.  Continue Creon and Dicyclomine for now.  At this point it has only been 1-1/2 weeks the patient has been taking this therapy. 3.  Recommend the patient follow-up in 2 months with Dr. Silverio Decamp to discuss further.  If symptoms are resolved then she can discuss what medications to stay on going forward.  If symptoms are not better than can discontinue current therapy and possibly try Viberzi or retrial of Budesonide given previous history of microscopic colitis  Ell[place]ise [place], PA-C [facility] 03/17/2018, 1:32 PM  Cc: Lajean Manes, MD

## 2018-04-13 NOTE — Progress Notes (Signed)
Reviewed and agree with documentation and assessment and plan. K. Veena Antanasia Kaczynski , MD   

## 2018-04-19 ENCOUNTER — Other Ambulatory Visit: Payer: Self-pay | Admitting: Physician Assistant

## 2018-04-22 ENCOUNTER — Encounter: Payer: Self-pay | Admitting: *Deleted

## 2018-04-26 ENCOUNTER — Other Ambulatory Visit (INDEPENDENT_AMBULATORY_CARE_PROVIDER_SITE_OTHER): Payer: BC Managed Care – PPO

## 2018-04-26 ENCOUNTER — Other Ambulatory Visit: Payer: Self-pay

## 2018-04-26 ENCOUNTER — Ambulatory Visit (INDEPENDENT_AMBULATORY_CARE_PROVIDER_SITE_OTHER): Payer: BC Managed Care – PPO | Admitting: Gastroenterology

## 2018-04-26 DIAGNOSIS — R109 Unspecified abdominal pain: Secondary | ICD-10-CM

## 2018-04-26 DIAGNOSIS — K588 Other irritable bowel syndrome: Secondary | ICD-10-CM

## 2018-04-26 DIAGNOSIS — K909 Intestinal malabsorption, unspecified: Secondary | ICD-10-CM

## 2018-04-26 DIAGNOSIS — R197 Diarrhea, unspecified: Secondary | ICD-10-CM

## 2018-04-26 DIAGNOSIS — K8681 Exocrine pancreatic insufficiency: Secondary | ICD-10-CM

## 2018-04-26 DIAGNOSIS — K8689 Other specified diseases of pancreas: Secondary | ICD-10-CM | POA: Diagnosis not present

## 2018-04-26 LAB — BASIC METABOLIC PANEL
BUN: 8 mg/dL (ref 6–23)
CO2: 29 mEq/L (ref 19–32)
Calcium: 9.4 mg/dL (ref 8.4–10.5)
Chloride: 95 mEq/L — ABNORMAL LOW (ref 96–112)
Creatinine, Ser: 0.61 mg/dL (ref 0.40–1.20)
GFR: 99.38 mL/min (ref >60.00)
Glucose, Bld: 83 mg/dL (ref 70–99)
Potassium: 4 mEq/L (ref 3.5–5.1)
Sodium: 131 mEq/L — ABNORMAL LOW (ref 135–145)

## 2018-04-26 MED ORDER — DICYCLOMINE HCL 20 MG PO TABS: 20.0000 mg | ORAL_TABLET | Freq: Four times a day (QID) | ORAL | 3 refills | Status: DC

## 2018-04-26 MED ORDER — COLESEVELAM HCL 625 MG PO TABS: 625.0000 mg | ORAL_TABLET | Freq: Two times a day (BID) | ORAL | 3 refills | Status: DC

## 2018-04-26 NOTE — Patient Instructions (Addendum)
Schedule CT pancreatic protocol with IV contrast, non emergent.  Patient is aware that there may be some delay in scheduling due to COVID-19  Check BMP prior to CT with contrast.  Take 2 capsules of Creon (72,000 units with meals), taking additional capsule (36,000 units) if having a large meal.  Increase dicyclomine to 20 mg every 6 hours as needed, up to 4 times daily  Start WelChol 1 tablet twice daily in 1 week if continues to have persistent diarrhea.  Follow-up in office visit in 2 months.

## 2018-04-26 NOTE — Progress Notes (Signed)
Vicki Jarvis    [id number]    1955/02/21  Primary Care Physician:Stoneking, Christiane Ha, MD  Referring Physician: Lajean Manes, MD [address] [place],  [address]  This service was provided via telemedicine due to [place] 19.  Patient location: Home Provider location: Office Used 2 patient identifiers to confirm the correct person. Explained the limitations in evaluation and management via telemedicine. Patient is aware of potential medical charges for this visit.  Patient consented to this virtual visit (via telephone/webex).  The persons participating in this telemedicine service were myself and the patient   Chief complaint: IBS  HPI: 63 year old female with complaints of intermittent diarrhea.  Bowel habits have improved to some extent with Creon.  She is currently taking 2 capsules of 36,000 units with meals.  She has 1 semi-formed to soft bowel movement per day on average 3-4 times a week and rest of the days she has multiple bowel movements ranging from 3-6 with some watery stool.  On most of those days with multiple bowel movements, she wakes up in early hours with abdominal cramping and fecal urgency. GI pathogen panel negative Fecal elastase low suggestive of moderate pancreatic insufficiency No history of chronic pancreatitis or alcohol abuse Denies any decreased appetite or weight loss She has not had any recent labs through her PMD, has not had physical in the past 2 to 3 years.  No recent imaging  Colonoscopy January 2020: Cecal adenomatous polyp removed, random right and left colon biopsies negative for microscopic colitis  Outpatient Encounter Medications as of 04/26/2018  Medication Sig  . alum & mag hydroxide-simeth (MAALOX/MYLANTA) 200-200-20 MG/5ML suspension Take by mouth.  . dicyclomine (BENTYL) 10 MG capsule TAKE 1 CAPSULE (10 MG TOTAL) BY MOUTH 4 (FOUR) TIMES DAILY - BEFORE MEALS AND AT BEDTIME.  . famotidine (PEPCID) 20 MG  tablet Take 20 mg by mouth daily.   . fluocinonide gel (LIDEX) 0.05 % Apply topically.  Marland Kitchen ibuprofen (ADVIL,MOTRIN) 200 MG tablet Take by mouth.  . lipase/protease/amylase (CREON) 36000 UNITS CPEP capsule 2 capsules during each meal and 1 capsule during each snack.  Marland Kitchen UNABLE TO FIND Pt gets allergy shots once a week   No facility-administered encounter medications on file as of 04/26/2018.     Allergies as of 04/26/2018 - Review Complete 04/22/2018  Allergen Reaction Noted  . Doxycycline  05/26/2008  . Penicillins  05/26/2008    Past Medical History:  Diagnosis Date  . Allergy   . Anxiety   . Arthritis   . Depression   . GERD (gastroesophageal reflux disease)   . Lymphocytic colitis   . Panic disorder     Past Surgical History:  Procedure Laterality Date  . COLONOSCOPY    . ESOPHAGOGASTRODUODENOSCOPY ENDOSCOPY    . thumb surgery Right     Family History  Problem Relation Age of Onset  . Breast cancer Mother   . Ulcerative colitis Mother   . Heart disease Mother   . Esophagitis Mother   . Colon cancer Maternal Grandmother   . Heart disease Other        grandfather  . Heart disease Other        uncle  . Irritable bowel syndrome Sister   . Colon polyps Neg Hx   . Diabetes Neg Hx   . Esophageal cancer Neg Hx   . Kidney disease Neg Hx   . Gallbladder disease Neg Hx   .  Stomach cancer Neg Hx   . Rectal cancer Neg Hx     Social History   Socioeconomic History  . Marital status: Married    Spouse name: Not on file  . Number of children: 0  . Years of education: Not on file  . Highest education level: Not on file  Occupational History  . Occupation: Secretary/retired  Social Needs  . Financial resource strain: Not on file  . Food insecurity:    Worry: Not on file    Inability: Not on file  . Transportation needs:    Medical: Not on file    Non-medical: Not on file  Tobacco Use  . Smoking status: Former Smoker    Types: Cigarettes    Last attempt to quit:  01/15/1981    Years since quitting: 37.3  . Smokeless tobacco: Never Used  Substance and Sexual Activity  . Alcohol use: Yes    Alcohol/week: 0.0 standard drinks    Comment: Occassionally  . Drug use: No  . Sexual activity: Not on file  Lifestyle  . Physical activity:    Days per week: Not on file    Minutes per session: Not on file  . Stress: Not on file  Relationships  . Social connections:    Talks on phone: Not on file    Gets together: Not on file    Attends religious service: Not on file    Active member of club or organization: Not on file    Attends meetings of clubs or organizations: Not on file    Relationship status: Not on file  . Intimate partner violence:    Fear of current or ex partner: Not on file    Emotionally abused: Not on file    Physically abused: Not on file    Forced sexual activity: Not on file  Other Topics Concern  . Not on file  Social History Narrative  . Not on file      Review of systems: Review of Systems as per HPI All other systems reviewed and are negative.   Physical Exam: Vitals were not taken and physical exam was not performed during this virtual visit.  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 Denzil Magnuson , MD   CC: Lajean Manes, MD

## 2018-05-07 ENCOUNTER — Ambulatory Visit: Payer: BC Managed Care – PPO | Admitting: Gastroenterology

## 2018-05-31 ENCOUNTER — Other Ambulatory Visit: Payer: Self-pay

## 2018-05-31 ENCOUNTER — Ambulatory Visit
Admission: RE | Admit: 2018-05-31 | Discharge: 2018-05-31 | Disposition: A | Discharge status: Home / Self Care | Payer: BC Managed Care – PPO | Source: Ambulatory Visit | Attending: Gastroenterology | Admitting: Gastroenterology

## 2018-05-31 DIAGNOSIS — K8681 Exocrine pancreatic insufficiency: Secondary | ICD-10-CM

## 2018-05-31 MED ORDER — IOPAMIDOL (ISOVUE-300) INJECTION 61%
100.0000 mL | Freq: Once | INTRAVENOUS | Status: AC | PRN
  Administered 2018-05-31: 100 mL via INTRAVENOUS

## 2018-06-02 ENCOUNTER — Telehealth: Payer: Self-pay | Admitting: Gastroenterology

## 2018-06-02 NOTE — Telephone Encounter (Signed)
Pt just started taking colesevelam for diarrhea.  Pt reported itching on her leg and has developed a rash.  Please advice.

## 2018-06-02 NOTE — Telephone Encounter (Signed)
Dr Nandigam Please advise  

## 2018-06-03 MED ORDER — COLESTIPOL HCL 1 G PO TABS: 1.0000 g | ORAL_TABLET | Freq: Two times a day (BID) | ORAL | 3 refills | Status: DC

## 2018-06-03 NOTE — Telephone Encounter (Signed)
Called patient to inform Dr Jillyn Hidden recommendations and sent Colestid to patients pharmacy. She will call us if she has any complications with this medication

## 2018-06-03 NOTE — Telephone Encounter (Signed)
Patient is following back on the previous msg.

## 2018-06-03 NOTE — Telephone Encounter (Signed)
Please advise patient to stop it. Will switch to Colestid 1gm BID. Thanks

## 2018-06-30 ENCOUNTER — Ambulatory Visit (INDEPENDENT_AMBULATORY_CARE_PROVIDER_SITE_OTHER): Payer: BC Managed Care – PPO | Admitting: Gastroenterology

## 2018-06-30 ENCOUNTER — Other Ambulatory Visit: Payer: Self-pay

## 2018-06-30 ENCOUNTER — Encounter: Payer: Self-pay | Admitting: Gastroenterology

## 2018-06-30 VITALS — Ht 63.0 in | Wt 114.0 lb

## 2018-06-30 DIAGNOSIS — K8689 Other specified diseases of pancreas: Secondary | ICD-10-CM

## 2018-06-30 DIAGNOSIS — K909 Intestinal malabsorption, unspecified: Secondary | ICD-10-CM | POA: Diagnosis not present

## 2018-06-30 DIAGNOSIS — K9089 Other intestinal malabsorption: Secondary | ICD-10-CM | POA: Diagnosis not present

## 2018-06-30 DIAGNOSIS — K580 Irritable bowel syndrome with diarrhea: Secondary | ICD-10-CM

## 2018-06-30 DIAGNOSIS — R197 Diarrhea, unspecified: Secondary | ICD-10-CM

## 2018-06-30 NOTE — Patient Instructions (Addendum)
Decrease Colestid to once daily  Continue Creon  Continue dicyclomine as needed  Start IBgard 1 capsule up to 3 times daily as needed  Follow-up telemedicine visit in 3 months ( You will need to call us for that appointment )  I appreciate the  opportunity to care for you  Thank You   Harl Bowie , MD

## 2018-06-30 NOTE — Progress Notes (Signed)
Vicki Jarvis    [id number]    Nov 10, 1955  Primary Care Physician:Stoneking, Christiane Ha, MD  Referring Physician: Lajean Manes, MD [address] [place], [place] [address]  This service was provided via audio and video telemedicine (Doximity) due to [place] 19 pandemic.  Patient location: Home Provider location: Office Used 2 patient identifiers to confirm the correct person. Explained the limitations in evaluation and management via telemedicine. Patient is aware of potential medical charges for this visit.  Patient consented to this virtual visit.  The persons participating in this telemedicine service were myself and the patient   Chief complaint: Diarrhea, abdominal cramping HPI:  63 year old female with chronic irritable bowel syndrome and pancreatic insufficiency for follow-up visit.  CT abdomen pelvis with contrast negative for any pancreatic cyst or lesions. Diarrhea is improving with Creon and Colestid.  She was having some constipation when she was taking Colestid twice a day and had to skip some doses. She is currently having 1-2 bowel movements daily.  Continues to have intermittent abdominal cramping, improves with dicyclomine as needed No weight loss.  No melena or blood per rectum.  Colonoscopy January 2020: Cecal adenomatous polyp removed, random right and left colon biopsies negative for microscopic colitis GI pathogen panel negative Fecal elastase low suggestive of moderate pancreatic insufficiency  Outpatient Encounter Medications as of 06/30/2018  Medication Sig  . alum & mag hydroxide-simeth (MAALOX/MYLANTA) 200-200-20 MG/5ML suspension Take by mouth.  . colestipol (COLESTID) 1 g tablet Take 1 tablet (1 g total) by mouth 2 (two) times daily.  Marland Kitchen dicyclomine (BENTYL) 20 MG tablet Take 1 tablet (20 mg total) by mouth every 6 (six) hours.  . famotidine (PEPCID) 20 MG tablet Take 20 mg by mouth daily.   . fluocinonide gel (LIDEX) 0.05 %  Apply topically.  Marland Kitchen ibuprofen (ADVIL,MOTRIN) 200 MG tablet Take by mouth. As needed only  . lipase/protease/amylase (CREON) 36000 UNITS CPEP capsule 2 capsules during each meal and 1 capsule during each snack. (Patient taking differently: 2 capsules during each meal and 1 capsule during each snack. Takes 3 with big meals)  . Multiple Vitamin (MULTIVITAMIN) tablet Take 1 tablet by mouth daily.  Marland Kitchen UNABLE TO FIND Pt gets allergy shots once a week   No facility-administered encounter medications on file as of 06/30/2018.     Allergies as of 06/30/2018 - Review Complete 06/30/2018  Allergen Reaction Noted  . Doxycycline  05/26/2008  . Penicillins  05/26/2008  . Welchol [colesevelam hcl] Rash 06/03/2018    Past Medical History:  Diagnosis Date  . Allergy   . Anxiety   . Arthritis   . Depression   . GERD (gastroesophageal reflux disease)   . Lymphocytic colitis   . Panic disorder     Past Surgical History:  Procedure Laterality Date  . COLONOSCOPY    . ESOPHAGOGASTRODUODENOSCOPY ENDOSCOPY    . thumb surgery Right     Family History  Problem Relation Age of Onset  . Breast cancer Mother   . Ulcerative colitis Mother   . Heart disease Mother   . Esophagitis Mother   . Colon cancer Maternal Grandmother   . Heart disease Other        grandfather  . Heart disease Other        uncle  . Irritable bowel syndrome Sister   . Colon polyps Neg Hx   . Diabetes Neg Hx   . Esophageal cancer Neg Hx   .  Kidney disease Neg Hx   . Gallbladder disease Neg Hx   . Stomach cancer Neg Hx   . Rectal cancer Neg Hx     Social History   Socioeconomic History  . Marital status: Married    Spouse name: Not on file  . Number of children: 0  . Years of education: Not on file  . Highest education level: Not on file  Occupational History  . Occupation: Secretary/retired  Social Needs  . Financial resource strain: Not on file  . Food insecurity:    Worry: Not on file    Inability: Not on  file  . Transportation needs:    Medical: Not on file    Non-medical: Not on file  Tobacco Use  . Smoking status: Former Smoker    Types: Cigarettes    Last attempt to quit: 01/15/1981    Years since quitting: 37.4  . Smokeless tobacco: Never Used  Substance and Sexual Activity  . Alcohol use: Yes    Alcohol/week: 0.0 standard drinks    Comment: Occassionally  . Drug use: No  . Sexual activity: Not on file  Lifestyle  . Physical activity:    Days per week: Not on file    Minutes per session: Not on file  . Stress: Not on file  Relationships  . Social connections:    Talks on phone: Not on file    Gets together: Not on file    Attends religious service: Not on file    Active member of club or organization: Not on file    Attends meetings of clubs or organizations: Not on file    Relationship status: Not on file  . Intimate partner violence:    Fear of current or ex partner: Not on file    Emotionally abused: Not on file    Physically abused: Not on file    Forced sexual activity: Not on file  Other Topics Concern  . Not on file  Social History Narrative  . Not on file      Review of systems: Review of Systems as per HPI All other systems reviewed and are negative.   Physical Exam: Vitals were not taken and physical exam was not performed during this virtual visit.  Data Reviewed:  Reviewed labs, radiology imaging, old records and pertinent past GI work up   Assessment and Plan/Recommendations:  63 year old female with chronic irritable bowel syndrome predominant diarrhea, pancreatic insufficiency and additional bile salt induced diarrhea  Decrease Colestid to once daily  Continue Creon  Continue dicyclomine as needed  Start IBgard 1 capsule up to 3 times daily as needed  Follow-up telemedicine visit in 3 months    K. Denzil Magnuson , MD   CC: Lajean Manes, MD

## 2018-07-02 ENCOUNTER — Encounter: Payer: Self-pay | Admitting: Gastroenterology

## 2018-07-04 ENCOUNTER — Other Ambulatory Visit: Payer: Self-pay | Admitting: Physician Assistant

## 2018-07-13 ENCOUNTER — Other Ambulatory Visit: Payer: Self-pay | Admitting: Geriatric Medicine

## 2018-07-13 DIAGNOSIS — M8580 Other specified disorders of bone density and structure, unspecified site: Secondary | ICD-10-CM

## 2018-07-29 ENCOUNTER — Ambulatory Visit
Admission: RE | Admit: 2018-07-29 | Discharge: 2018-07-29 | Disposition: A | Discharge status: Home / Self Care | Payer: BC Managed Care – PPO | Source: Ambulatory Visit | Attending: Geriatric Medicine | Admitting: Geriatric Medicine

## 2018-07-29 DIAGNOSIS — M8580 Other specified disorders of bone density and structure, unspecified site: Secondary | ICD-10-CM

## 2018-07-30 ENCOUNTER — Other Ambulatory Visit: Payer: Self-pay | Admitting: Geriatric Medicine

## 2018-07-30 DIAGNOSIS — M8580 Other specified disorders of bone density and structure, unspecified site: Secondary | ICD-10-CM

## 2018-08-18 ENCOUNTER — Other Ambulatory Visit: Payer: Self-pay | Admitting: Gastroenterology

## 2018-08-31 ENCOUNTER — Encounter: Payer: Self-pay | Admitting: Gastroenterology

## 2018-09-28 ENCOUNTER — Other Ambulatory Visit (HOSPITAL_COMMUNITY)
Admission: RE | Admit: 2018-09-28 | Discharge: 2018-09-28 | Disposition: A | Discharge status: Home / Self Care | Payer: BC Managed Care – PPO | Source: Ambulatory Visit | Attending: Obstetrics and Gynecology | Admitting: Obstetrics and Gynecology

## 2018-09-28 ENCOUNTER — Other Ambulatory Visit: Payer: Self-pay | Admitting: Obstetrics and Gynecology

## 2018-09-28 DIAGNOSIS — Z01419 Encounter for gynecological examination (general) (routine) without abnormal findings: Secondary | ICD-10-CM | POA: Insufficient documentation

## 2018-09-30 ENCOUNTER — Other Ambulatory Visit: Payer: Self-pay | Admitting: Geriatric Medicine

## 2018-09-30 DIAGNOSIS — R1319 Other dysphagia: Secondary | ICD-10-CM

## 2018-09-30 DIAGNOSIS — R1310 Dysphagia, unspecified: Secondary | ICD-10-CM

## 2018-09-30 LAB — CYTOLOGY - PAP
Diagnosis: NEGATIVE
HPV: NOT DETECTED

## 2018-10-05 ENCOUNTER — Encounter: Payer: Self-pay | Admitting: Gastroenterology

## 2018-10-05 ENCOUNTER — Ambulatory Visit: Payer: BC Managed Care – PPO | Admitting: Gastroenterology

## 2018-10-05 ENCOUNTER — Other Ambulatory Visit: Payer: Self-pay

## 2018-10-05 VITALS — BP 124/70 | HR 72 | Temp 98.7°F | Ht 63.0 in | Wt 113.0 lb

## 2018-10-05 DIAGNOSIS — K8689 Other specified diseases of pancreas: Secondary | ICD-10-CM

## 2018-10-05 DIAGNOSIS — K219 Gastro-esophageal reflux disease without esophagitis: Secondary | ICD-10-CM

## 2018-10-05 DIAGNOSIS — K9089 Other intestinal malabsorption: Secondary | ICD-10-CM

## 2018-10-05 DIAGNOSIS — R1310 Dysphagia, unspecified: Secondary | ICD-10-CM

## 2018-10-05 NOTE — Patient Instructions (Signed)
You have been scheduled for an endoscopy. Please follow written instructions given to you at your visit today. If you use inhalers (even only as needed), please bring them with you on the day of your procedure.  We will send in Prevalite prescription to your pharmacy   Gastroesophageal Reflux Disease, Adult Gastroesophageal reflux (GER) happens when acid from the stomach flows up into the tube that connects the mouth and the stomach (esophagus). Normally, food travels down the esophagus and stays in the stomach to be digested. However, when a person has GER, food and stomach acid sometimes move back up into the esophagus. If this becomes a more serious problem, the person may be diagnosed with a disease called gastroesophageal reflux disease (GERD). GERD occurs when the reflux:  Happens often.  Causes frequent or severe symptoms.  Causes problems such as damage to the esophagus. When stomach acid comes in contact with the esophagus, the acid may cause soreness (inflammation) in the esophagus. Over time, GERD may create small holes (ulcers) in the lining of the esophagus. What are the causes? This condition is caused by a problem with the muscle between the esophagus and the stomach (lower esophageal sphincter, or LES). Normally, the LES muscle closes after food passes through the esophagus to the stomach. When the LES is weakened or abnormal, it does not close properly, and that allows food and stomach acid to go back up into the esophagus. The LES can be weakened by certain dietary substances, medicines, and medical conditions, including:  Tobacco use.  Pregnancy.  Having a hiatal hernia.  Alcohol use.  Certain foods and beverages, such as coffee, chocolate, onions, and peppermint. What increases the risk? You are more likely to develop this condition if you:  Have an increased body weight.  Have a connective tissue disorder.  Use NSAID medicines. What are the signs or symptoms?  Symptoms of this condition include:  Heartburn.  Difficult or painful swallowing.  The feeling of having a lump in the throat.  Abitter taste in the mouth.  Bad breath.  Having a large amount of saliva.  Having an upset or bloated stomach.  Belching.  Chest pain. Different conditions can cause chest pain. Make sure you see your health care provider if you experience chest pain.  Shortness of breath or wheezing.  Ongoing (chronic) cough or a night-time cough.  Wearing away of tooth enamel.  Weight loss. How is this diagnosed? Your health care provider will take a medical history and perform a physical exam. To determine if you have mild or severe GERD, your health care provider may also monitor how you respond to treatment. You may also have tests, including:  A test to examine your stomach and esophagus with a small camera (endoscopy).  A test thatmeasures the acidity level in your esophagus.  A test thatmeasures how much pressure is on your esophagus.  A barium swallow or modified barium swallow test to show the shape, size, and functioning of your esophagus. How is this treated? The goal of treatment is to help relieve your symptoms and to prevent complications. Treatment for this condition may vary depending on how severe your symptoms are. Your health care provider may recommend:  Changes to your diet.  Medicine.  Surgery. Follow these instructions at home: Eating and drinking   Follow a diet as recommended by your health care provider. This may involve avoiding foods and drinks such as: ? Coffee and tea (with or without caffeine). ? Drinks that containalcohol. ?  Energy drinks and sports drinks. ? Carbonated drinks or sodas. ? Chocolate and cocoa. ? Peppermint and mint flavorings. ? Garlic and onions. ? Horseradish. ? Spicy and acidic foods, including peppers, chili powder, curry powder, vinegar, hot sauces, and barbecue sauce. ? Citrus fruit juices  and citrus fruits, such as oranges, lemons, and limes. ? Tomato-based foods, such as red sauce, chili, salsa, and pizza with red sauce. ? Fried and fatty foods, such as donuts, french fries, potato chips, and high-fat dressings. ? High-fat meats, such as hot dogs and fatty cuts of red and white meats, such as rib eye steak, sausage, ham, and bacon. ? High-fat dairy items, such as whole milk, butter, and cream cheese.  Eat small, frequent meals instead of large meals.  Avoid drinking large amounts of liquid with your meals.  Avoid eating meals during the 2-3 hours before bedtime.  Avoid lying down right after you eat.  Do not exercise right after you eat. Lifestyle   Do not use any products that contain nicotine or tobacco, such as cigarettes, e-cigarettes, and chewing tobacco. If you need help quitting, ask your health care provider.  Try to reduce your stress by using methods such as yoga or meditation. If you need help reducing stress, ask your health care provider.  If you are overweight, reduce your weight to an amount that is healthy for you. Ask your health care provider for guidance about a safe weight loss goal. General instructions  Pay attention to any changes in your symptoms.  Take over-the-counter and prescription medicines only as told by your health care provider. Do not take aspirin, ibuprofen, or other NSAIDs unless your health care provider told you to do so.  Wear loose-fitting clothing. Do not wear anything tight around your waist that causes pressure on your abdomen.  Raise (elevate) the head of your bed about 6 inches (15 cm).  Avoid bending over if this makes your symptoms worse.  Keep all follow-up visits as told by your health care provider. This is important. Contact a health care provider if:  You have: ? New symptoms. ? Unexplained weight loss. ? Difficulty swallowing or it hurts to swallow. ? Wheezing or a persistent cough. ? A hoarse voice.   Your symptoms do not improve with treatment. Get help right away if you:  Have pain in your arms, neck, jaw, teeth, or back.  Feel sweaty, dizzy, or light-headed.  Have chest pain or shortness of breath.  Vomit and your vomit looks like blood or coffee grounds.  Faint.  Have stool that is bloody or black.  Cannot swallow, drink, or eat. Summary  Gastroesophageal reflux happens when acid from the stomach flows up into the esophagus. GERD is a disease in which the reflux happens often, causes frequent or severe symptoms, or causes problems such as damage to the esophagus.  Treatment for this condition may vary depending on how severe your symptoms are. Your health care provider may recommend diet and lifestyle changes, medicine, or surgery.  Contact a health care provider if you have new or worsening symptoms.  Take over-the-counter and prescription medicines only as told by your health care provider. Do not take aspirin, ibuprofen, or other NSAIDs unless your health care provider told you to do so.  Keep all follow-up visits as told by your health care provider. This is important. This information is not intended to replace advice given to you by your health care provider. Make sure you discuss any questions you have  with your health care provider. Document Released: 10/16/2004 Document Revised: 07/15/2017 Document Reviewed: 07/15/2017 Elsevier Patient Education  2020 [employer].

## 2018-10-05 NOTE — Progress Notes (Signed)
Vicki Jarvis    [id number]    1955-07-08  Primary Care Physician:Stoneking, Christiane Ha, MD  Referring Physician: Lajean Manes, MD [address] [place],  [place] [address]   Chief complaint:  Dysphagia, diarrhea  HPI:  63 year old female here for follow up visit, last seen in June 2020  She is having dysphagia to pills, globus sensation and also intermittent pain or discomfort on swallowing since she took Alendronate. She stopped taking it end of August but continues to have epigastric discomfort and intermittent difficulty swallowing large pills. Denies any melena or blood per rectum.  No vomiting. She has not been able to take Colestid tablets due to dysphagia.  Diarrhea is slightly worse but not as bad as before.  She is able to still do the Creon.  Relevant GI Hx:  CT abdomen pelvis with contrast May 2020: Negative for any pancreatic cyst or lesions.  Colonoscopy January 2020:Cecal adenomatous polyp removed, random right and left colon biopsies negative for microscopic colitis GI pathogen panel negative Fecal elastase low suggestive of moderate pancreatic insufficiency   Outpatient Encounter Medications as of 10/05/2018  Medication Sig  . alum & mag hydroxide-simeth (MAALOX/MYLANTA) 200-200-20 MG/5ML suspension Take by mouth.  . colestipol (COLESTID) 1 g tablet Take 1 tablet (1 g total) by mouth 2 (two) times daily.  Marland Kitchen CREON 36000 units CPEP capsule 2 CAPSULES DURING [facility] MEAL AND 1 CAPSULE DURING [facility].  Marland Kitchen dicyclomine (BENTYL) 20 MG tablet TAKE 1 TABLET (20 MG TOTAL) BY MOUTH EVERY 6 (SIX) HOURS.  . famotidine (PEPCID) 20 MG tablet Take 20 mg by mouth daily.   . Multiple Vitamin (MULTIVITAMIN) tablet Take 1 tablet by mouth daily.  Marland Kitchen UNABLE TO FIND Pt gets allergy shots once a week  . [DISCONTINUED] fluocinonide gel (LIDEX) 0.05 % Apply topically.  . [DISCONTINUED] ibuprofen (ADVIL,MOTRIN) 200 MG tablet Take by mouth. As needed only   No  facility-administered encounter medications on file as of 10/05/2018.     Allergies as of 10/05/2018 - Review Complete 10/05/2018  Allergen Reaction Noted  . Doxycycline  05/26/2008  . Penicillins  05/26/2008  . Welchol [colesevelam hcl] Rash 06/03/2018    Past Medical History:  Diagnosis Date  . Allergy   . Anxiety   . Arthritis   . Depression   . GERD (gastroesophageal reflux disease)   . Lymphocytic colitis   . Panic disorder     Past Surgical History:  Procedure Laterality Date  . COLONOSCOPY    . ESOPHAGOGASTRODUODENOSCOPY ENDOSCOPY    . thumb surgery Right     Family History  Problem Relation Age of Onset  . Breast cancer Mother   . Ulcerative colitis Mother   . Heart disease Mother   . Esophagitis Mother   . Colon cancer Maternal Grandmother   . Heart disease Other        grandfather  . Heart disease Other        uncle  . Irritable bowel syndrome Sister   . Colon polyps Neg Hx   . Diabetes Neg Hx   . Esophageal cancer Neg Hx   . Kidney disease Neg Hx   . Gallbladder disease Neg Hx   . Stomach cancer Neg Hx   . Rectal cancer Neg Hx     Social History   Socioeconomic History  . Marital status: Married    Spouse name: Not on file  . Number of children: 0  .  Years of education: Not on file  . Highest education level: Not on file  Occupational History  . Occupation: Secretary/retired  Social Needs  . Financial resource strain: Not on file  . Food insecurity    Worry: Not on file    Inability: Not on file  . Transportation needs    Medical: Not on file    Non-medical: Not on file  Tobacco Use  . Smoking status: Former Smoker    Types: Cigarettes    Quit date: 01/15/1981    Years since quitting: 37.7  . Smokeless tobacco: Never Used  Substance and Sexual Activity  . Alcohol use: Yes    Alcohol/week: 0.0 standard drinks    Comment: Occassionally  . Drug use: No  . Sexual activity: Not on file  Lifestyle  . Physical activity    Days per  week: Not on file    Minutes per session: Not on file  . Stress: Not on file  Relationships  . Social Herbalist on phone: Not on file    Gets together: Not on file    Attends religious service: Not on file    Active member of club or organization: Not on file    Attends meetings of clubs or organizations: Not on file    Relationship status: Not on file  . Intimate partner violence    Fear of current or ex partner: Not on file    Emotionally abused: Not on file    Physically abused: Not on file    Forced sexual activity: Not on file  Other Topics Concern  . Not on file  Social History Narrative  . Not on file      Review of systems: Review of Systems  Constitutional: Negative for fever and chills.  HENT: Negative.   Eyes: Negative for blurred vision.  Respiratory: Negative for cough, shortness of breath and wheezing.   Cardiovascular: Negative for chest pain and palpitations.  Gastrointestinal: as per HPI Genitourinary: Negative for dysuria, urgency, frequency and hematuria.  Musculoskeletal: Negative for myalgias, back pain and positive for joint pain.  Skin: Negative for itching and rash.  Neurological: Negative for dizziness, tremors, focal weakness, seizures and loss of consciousness.  Endo/Heme/Allergies: Positive for seasonal allergies.  Psychiatric/Behavioral: Negative for depression, suicidal ideas and hallucinations.  All other systems reviewed and are negative.   Physical Exam: Vitals:   10/05/18 0934  BP: 124/70  Pulse: 72  Temp: 98.7 F (37.1 C)   Body mass index is 20.02 kg/m. Gen:      No acute distress HEENT:  EOMI, sclera anicteric Neck:     No masses; no thyromegaly Lungs:    Clear to auscultation bilaterally; normal respiratory effort CV:         Regular rate and rhythm; no murmurs Abd:      + bowel sounds; soft, non-tender; no palpable masses, no distension Ext:    No edema; adequate peripheral perfusion Skin:      Warm and dry;  no rash Neuro: alert and oriented x 3 Psych: normal mood and affect  Data Reviewed:  Reviewed labs, radiology imaging, old records and pertinent past GI work up   Assessment and Plan/Recommendations:  63 year old female with complaints of dysphagia, odynophagia and epigastric abdominal pain after starting alendronate concerning for possible pill esophagitis We will schedule for EGD for evaluation and possible dilation if needed  Continue Pepcid as needed and antireflux measures for GERD  We will switch to Prevalite  instead of Colestid due to dysphagia, 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 All questions answered. The patient agrees to proceed.     Damaris Hippo , MD    CC: Lajean Manes, MD

## 2018-10-06 ENCOUNTER — Telehealth: Payer: Self-pay | Admitting: Gastroenterology

## 2018-10-06 ENCOUNTER — Other Ambulatory Visit: Payer: Self-pay | Admitting: Gastroenterology

## 2018-10-06 ENCOUNTER — Other Ambulatory Visit: Payer: BC Managed Care – PPO

## 2018-10-06 ENCOUNTER — Ambulatory Visit (AMBULATORY_SURGERY_CENTER): Payer: BC Managed Care – PPO | Admitting: Gastroenterology

## 2018-10-06 ENCOUNTER — Encounter: Payer: Self-pay | Admitting: Gastroenterology

## 2018-10-06 VITALS — BP 132/80 | HR 70 | Temp 97.7°F | Resp 22 | Ht 63.0 in | Wt 113.0 lb

## 2018-10-06 DIAGNOSIS — K2970 Gastritis, unspecified, without bleeding: Secondary | ICD-10-CM | POA: Diagnosis not present

## 2018-10-06 DIAGNOSIS — K222 Esophageal obstruction: Secondary | ICD-10-CM

## 2018-10-06 DIAGNOSIS — R1310 Dysphagia, unspecified: Secondary | ICD-10-CM | POA: Diagnosis present

## 2018-10-06 MED ORDER — SUCRALFATE 1 GM/10ML PO SUSP: 1.0000 g | Freq: Two times a day (BID) | ORAL | 0 refills | Status: DC

## 2018-10-06 MED ORDER — SODIUM CHLORIDE 0.9 % IV SOLN: 500.0000 mL | Freq: Once | INTRAVENOUS | Status: DC

## 2018-10-06 NOTE — Progress Notes (Signed)
KA - temps [place] - vitals

## 2018-10-06 NOTE — Progress Notes (Signed)
To PACU, VSS. Report to [email or web address] 

## 2018-10-06 NOTE — Op Note (Signed)
[place] Patient Name: Vicki Jarvis Procedure Date: 10/06/2018 1:49 PM MRN: [id number] Endoscopist: Mauri Pole , MD Age: 63 Referring MD:  Date of Birth: [birth year] Gender: Female Account #: [id number] Procedure:                Upper GI endoscopy Indications:              Dysphagia, Epigastric abdominal pain Medicines:                Monitored Anesthesia Care Procedure:                Pre-Anesthesia Assessment:                           - Prior to the procedure, a History and Physical                            was performed, and patient medications and                            allergies were reviewed. The patient's tolerance of                            previous anesthesia was also reviewed. The risks                            and benefits of the procedure and the sedation                            options and risks were discussed with the patient.                            All questions were answered, and informed consent                            was obtained. Prior Anticoagulants: The patient has                            taken no previous anticoagulant or antiplatelet                            agents. ASA Grade Assessment: II - A patient with                            mild systemic disease. After reviewing the risks                            and benefits, the patient was deemed in                            satisfactory condition to undergo the procedure.                           After obtaining informed consent, the endoscope was  passed under direct vision. Throughout the                            procedure, the patient's blood pressure, pulse, and                            oxygen saturations were monitored continuously. The                            Endoscope was introduced through the mouth, and                            advanced to the second part of duodenum. The upper                            GI endoscopy was  accomplished without difficulty.                            The patient tolerated the procedure well. Scope In: Scope Out: Findings:                 The Z-line was regular and was found 38 cm from the                            incisors.                           No endoscopic abnormality was evident in the                            esophagus to explain the patient's complaint of                            dysphagia. It was decided, however, to proceed with                            dilation of the entire esophagus. The scope was                            withdrawn. Dilation was performed with a Maloney                            dilator with mild resistance at 48 Fr and 50 Fr.                            The dilation site was examined following endoscope                            reinsertion and showed mild mucosal disruption.                           Patchy mild inflammation characterized by adherent  blood, congestion (edema), erosions and erythema                            was found in the entire examined stomach. Biopsies                            were taken with a cold forceps for Helicobacter                            pylori testing.                           The examined duodenum was normal. Complications:            No immediate complications. Estimated Blood Loss:     Estimated blood loss was minimal. Impression:               - Z-line regular, 38 cm from the incisors.                           - No endoscopic esophageal abnormality to explain                            patient's dysphagia. Esophagus dilated. Dilated.                           - Gastritis. Biopsied.                           - Normal examined duodenum. Recommendation:           - Patient has a contact number available for                            emergencies. The signs and symptoms of potential                            delayed complications were discussed with the                             patient. Return to normal activities tomorrow.                            Written discharge instructions were provided to the                            patient.                           - Resume previous diet.                           - Continue present medications.                           - Await pathology results.                           -  Use sucralfate suspension 1 gram PO BID for 2                            weeks. Mauri Pole, MD 10/06/2018 2:19:31 PM This report has been signed electronically.

## 2018-10-06 NOTE — Patient Instructions (Signed)
YOU HAD AN ENDOSCOPIC PROCEDURE TODAY AT THE [facility]:   Refer to the procedure report that was given to you for any specific questions about what was found during the examination.  If the procedure report does not answer your questions, please call your gastroenterologist to clarify.  If you requested that your care partner not be given the details of your procedure findings, then the procedure report has been included in a sealed envelope for you to review at your convenience later.  **Handout given on Gastritis**  YOU SHOULD EXPECT: Some feelings of bloating in the abdomen. Passage of more gas than usual.  Walking can help get rid of the air that was put into your GI tract during the procedure and reduce the bloating. If you had a lower endoscopy (such as a colonoscopy or flexible sigmoidoscopy) you may notice spotting of blood in your stool or on the toilet paper. If you underwent a bowel prep for your procedure, you may not have a normal bowel movement for a few days.  Please Note:  You might notice some irritation and congestion in your nose or some drainage.  This is from the oxygen used during your procedure.  There is no need for concern and it should clear up in a day or so.  SYMPTOMS TO REPORT IMMEDIATELY:   Following upper endoscopy (EGD)  Vomiting of blood or coffee ground material  New chest pain or pain under the shoulder blades  Painful or persistently difficult swallowing  New shortness of breath  Fever of 100F or higher  Black, tarry-looking stools  For urgent or emergent issues, a gastroenterologist can be reached at any hour by calling [phone].   DIET:  We do recommend a small meal at first, but then you may proceed to your regular diet.  Drink plenty of fluids but you should avoid alcoholic beverages for 24 hours.  ACTIVITY:  You should plan to take it easy for the rest of today and you should NOT DRIVE or use heavy machinery until tomorrow  (because of the sedation medicines used during the test).    FOLLOW UP: Our staff will call the number listed on your records 48-72 hours following your procedure to check on you and address any questions or concerns that you may have regarding the information given to you following your procedure. If we do not reach you, we will leave a message.  We will attempt to reach you two times.  During this call, we will ask if you have developed any symptoms of COVID 19. If you develop any symptoms (ie: fever, flu-like symptoms, shortness of breath, cough etc.) before then, please call [phone].  If you test positive for Covid 19 in the 2 weeks post procedure, please call and report this information to us.    If any biopsies were taken you will be contacted by phone or by letter within the next 1-3 weeks.  Please call us at [phone] if you have not heard about the biopsies in 3 weeks.    SIGNATURES/CONFIDENTIALITY: You and/or your care partner have signed paperwork which will be entered into your electronic medical record.  These signatures attest to the fact that that the information above on your After Visit Summary has been reviewed and is understood.  Full responsibility of the confidentiality of this discharge information lies with you and/or your care-partner. 

## 2018-10-06 NOTE — Telephone Encounter (Signed)
Patient called after hours with difficulty swallowing sips of water after her endoscopy earlier today.  EGD with balloon dilation performed at 2pm for dysphagia to pills, globus, and intermittent pain on swallowing. Empiric dilation was performed with a Maloney dilator with mild resistance at 48 Fr and 50 Fr. The dilation site was examined following endoscope reinsertion and showed mild mucosal disruption.  Tolerating her own secretions with some discomfort. But, she is concerned about the pain. Experiencing increased sharp esophageal pain when swallowing anything else, including water. Symptoms are worse than those prior to her procedure. She distinguishes her post-procedure pain as a "tight squeeze" as if "something is stuck in my throat." It is difficult is further delineate the post-procedure symptoms from her pre-procedure symptoms.  No chest pain, vomiting, shortness of breath, fever, chills.  Given her symptoms, I stated my concerns and recommended that she go to the ER to evaluate for possible perforation. She was resistant due to cost and told me that she would only go if her symptoms persisted or worsened.    Please call the patient tomorrow morning for follow-up.

## 2018-10-06 NOTE — Progress Notes (Signed)
Called to room to assist during endoscopic procedure.  Patient ID and intended procedure confirmed with present staff. Received instructions for my participation in the procedure from the performing physician.  

## 2018-10-07 ENCOUNTER — Telehealth: Payer: Self-pay

## 2018-10-07 MED ORDER — LIDOCAINE VISCOUS HCL 2 % MT SOLN: 5.0000 mL | OROMUCOSAL | 0 refills | Status: DC | PRN

## 2018-10-07 NOTE — Telephone Encounter (Signed)
Called patient this morning to check on her. She said her throat pain has improved minimally but was able to get water down better than last night. She said the carafate made her have a dry mouth so she has not had it today but would take it soon. She has had about 30 ounces of water since her procedure and has not been able to tolerate anything else. I explained that it should get better with the next couple of days and that I would call back in a few hours to check on her. She agreed and had no further questions or concerns.

## 2018-10-07 NOTE — Telephone Encounter (Signed)
Called patient. She is tolerating liquids, has phlegm back of throat, coughed up clear, no bleeding, fever, chest pain, SOB, nausea or vomiting. Continue carafate twice daily and also advised Lidocaine suspension 2%, 5cc every 4-6 hours as needed. Avoid dring or eating for an hour or 2 after lidocaine to prevent aspiration. Encouraged sips of fluid and soft diet as tolerated.

## 2018-10-08 ENCOUNTER — Telehealth: Payer: Self-pay | Admitting: *Deleted

## 2018-10-08 NOTE — Telephone Encounter (Signed)
  Follow up Call-  Call back number 10/06/2018 01/25/2018  Post procedure Call Back phone  # [phone]  Permission to leave phone message Yes Yes  Some recent data might be hidden    See TE from 10-06-18 and 10-07-18.  Pt states she still has a sore throat and is coughing up clear phlegm.  No fever.  She was able to eat soft foods for lunch and dinner yesterday and is keeping fluids down.  She did sleep in her recliner and woke up coughing several times.    Dr. Silverio Decamp, She is asking is she should take cough medicine of any kind to help with her cough.  Please advise.  Thanks, [employer]

## 2018-10-08 NOTE — Telephone Encounter (Signed)
Clarified with Dr. Silverio Decamp- pt is to swish and swallow Lidocaine suspension and then not eat or drink for 1-2 hours after taking medication- pt voiced understanding regarding these clarifications.  Pt given Dr. Woodward Ku other recommendations and understanding voiced.

## 2018-10-08 NOTE — Telephone Encounter (Signed)
Mucinex cough syrup (OTC) may help with phelgm and cough, take 10cc as needed . Please encourage her to cont soft diet and I will check back on Monday, call with any changes or worsening symptoms. Thanks

## 2018-10-15 ENCOUNTER — Telehealth: Payer: Self-pay | Admitting: Gastroenterology

## 2018-10-15 ENCOUNTER — Encounter: Payer: Self-pay | Admitting: Gastroenterology

## 2018-10-15 DIAGNOSIS — K222 Esophageal obstruction: Secondary | ICD-10-CM

## 2018-10-15 DIAGNOSIS — K2970 Gastritis, unspecified, without bleeding: Secondary | ICD-10-CM

## 2018-10-15 DIAGNOSIS — R1310 Dysphagia, unspecified: Secondary | ICD-10-CM

## 2018-10-15 MED ORDER — SUCRALFATE 1 GM/10ML PO SUSP: 1.0000 g | Freq: Two times a day (BID) | ORAL | 1 refills | Status: DC

## 2018-10-15 NOTE — Telephone Encounter (Signed)
Her throat feels better, no longer has discomfort on swallowing.  She is able to swallow tablets.  Continues to have globus sensation. Advised her to continue Carafate twice daily for additional 4 weeks, will send refills.

## 2018-11-02 ENCOUNTER — Other Ambulatory Visit: Payer: Self-pay | Admitting: Gastroenterology

## 2018-11-26 ENCOUNTER — Telehealth: Payer: Self-pay | Admitting: Gastroenterology

## 2018-11-26 ENCOUNTER — Other Ambulatory Visit: Payer: Self-pay

## 2018-11-26 DIAGNOSIS — K222 Esophageal obstruction: Secondary | ICD-10-CM

## 2018-11-26 DIAGNOSIS — R1310 Dysphagia, unspecified: Secondary | ICD-10-CM

## 2018-11-26 DIAGNOSIS — K2970 Gastritis, unspecified, without bleeding: Secondary | ICD-10-CM

## 2018-11-26 MED ORDER — SUCRALFATE 1 GM/10ML PO SUSP: 1.0000 g | Freq: Two times a day (BID) | ORAL | 1 refills | Status: DC

## 2018-11-26 NOTE — Telephone Encounter (Signed)
Patient aware. [facility] [place]. Patient states she is expecting insurance to deny payment of the Carafate.  Appointment for re-evaluation scheduled for 12/10/18 at [place].

## 2018-11-26 NOTE — Telephone Encounter (Signed)
Pharmacy will not pay for the medication.Wanted to make office/MD aware.

## 2018-11-26 NOTE — Telephone Encounter (Signed)
Pt is calling saying that she is still having problems swallowing. She said that when she drinks water or anything she feels like it goes down the wrong way and into her airway and she starts coughing. Asking if there is anything she can do to help. Also, She took sucralfate for the 6 weeks and she finished the 6 weeks and said that it helped a little bit- she said she still had trouble a little bit but it was not as bad. She said she took all the refills but one but she did not know if Dr. Silverio Decamp wanted her to take the last one or not cause it was after the 6 weeks.

## 2018-11-26 NOTE — Telephone Encounter (Signed)
Vicki Jarvis will you let me know if you get anything [facility] [place] on her Car[facility]ate Rx? Thanks

## 2018-11-26 NOTE — Telephone Encounter (Signed)
It is okay to continue sucralfate for longer.  Please send her refills for sucralfate.  Please schedule follow-up office visit next available to discuss treatment plan further.

## 2018-11-29 NOTE — Telephone Encounter (Signed)
Carafate changed to tablets. Patient instructed on changing the tablets into a slurry.

## 2018-11-29 NOTE — Telephone Encounter (Signed)
Patient is calling and asking if the carafate has been approved? She is asking if we have talked to the insurance company- and if it will be approved or not. She is trying to figure out of she will need to try and pay for it out of pocket or if we were able to get it worked out with [employer].

## 2018-12-10 ENCOUNTER — Encounter: Payer: Self-pay | Admitting: Gastroenterology

## 2018-12-10 ENCOUNTER — Ambulatory Visit: Payer: BC Managed Care – PPO | Admitting: Gastroenterology

## 2018-12-10 VITALS — BP 104/60 | HR 99 | Temp 98.6°F | Ht 63.0 in | Wt 111.0 lb

## 2018-12-10 DIAGNOSIS — J029 Acute pharyngitis, unspecified: Secondary | ICD-10-CM

## 2018-12-10 DIAGNOSIS — K5900 Constipation, unspecified: Secondary | ICD-10-CM | POA: Diagnosis not present

## 2018-12-10 DIAGNOSIS — R1312 Dysphagia, oropharyngeal phase: Secondary | ICD-10-CM | POA: Diagnosis not present

## 2018-12-10 DIAGNOSIS — R059 Cough, unspecified: Secondary | ICD-10-CM

## 2018-12-10 DIAGNOSIS — R05 Cough: Secondary | ICD-10-CM

## 2018-12-10 NOTE — Patient Instructions (Addendum)
Today we are giving you samples of Linzess 72 mcg, take one daily on an empty stomach.   You have been scheduled for a SLP Fees Study at [facility] Radiology (1st floor of the hospital) on _____ at _______. Please arrive 15 minutes prior to your appointment for registration. Make certain not to have anything to eat or drink 3 hours prior to your test. If you need to reschedule for any reason, please contact radiology at [phone]  to do so. __________________________________________________________________    We have given you samples of Linzess 72 mcg to try, if you need a prescription call us back   If you are age 62 or older, your body mass index should be between 23-30. Your Body mass index is 19.66 kg/m. If this is out of the aforementioned range listed, please consider follow up with your Primary Care Provider.  If you are age 79 or younger, your body mass index should be between 19-25. Your Body mass index is 19.66 kg/m. If this is out of the aformentioned range listed, please consider follow up with your Primary Care Provider.    I appreciate the  opportunity to care for you  Thank You   Harl Bowie , MD

## 2018-12-10 NOTE — Progress Notes (Signed)
Vicki Jarvis    [id number]    12-18-1955  Primary Care Physician:Stoneking, Christiane Ha, MD  Referring Physician: Lajean Manes, MD [address] [place],  [place] [address]   Chief complaint: Dysphagia HPI:  63 year old female here for follow-up.  She continues to have globus sensation and dysphagia.  No significant improvement after EGD with dilation.  EGD October 06, 2018: Esophagus appeared normal with no abnormality to explain dysphagia.  Empirically dilated with Maloney bougie dilator to 31 [place], showed mild mucosal disruption.  Mild gastritis, biopsies negative for H. Pylori  She feels when she eats or drinks food goes through the wrong tube and she has to cough excessively, sometimes coughs up small bits of food.  Some days she cannot lay down because she is choking on her saliva.  She feels all her symptoms started after she took alendronate, prior to that she was not having any difficulty swallowing.  Sucralfate is not helping.  She feels she is getting more constipated since she started taking it.  She has lost significant weight in the past few months.  Relevant GI Hx:  CT abdomen pelvis with contrast May 2020: Negative for any pancreatic cyst or lesions.  Colonoscopy January 2020:Cecal adenomatous polyp removed, random right and left colon biopsies negative for microscopic colitis GI pathogen panel negative Fecal elastase low suggestive of moderate pancreatic insufficiency Outpatient Encounter Medications as of 12/10/2018  Medication Sig  . colestipol (COLESTID) 1 g tablet Take 1 g by mouth 2 (two) times daily as needed.  Marland Kitchen CREON 36000 units CPEP capsule 2 CAPSULES DURING [facility] MEAL AND 1 CAPSULE DURING [facility].  Marland Kitchen dicyclomine (BENTYL) 20 MG tablet Take 20 mg by mouth every 6 (six) hours as needed for spasms.  . famotidine (PEPCID) 20 MG tablet Take 10 mg by mouth daily.   . Multiple Vitamin (MULTIVITAMIN) tablet Take 1 tablet by mouth  daily.  . sucralfate (CARAFATE) 1 GM/10ML suspension Take 10 mLs (1 g total) by mouth 2 (two) times daily for 14 days.  Marland Kitchen UNABLE TO FIND Pt gets allergy shots once a week  . [DISCONTINUED] alum & mag hydroxide-simeth (MAALOX/MYLANTA) 200-200-20 MG/5ML suspension Take by mouth.  . [DISCONTINUED] colestipol (COLESTID) 1 g tablet Take 1 tablet (1 g total) by mouth 2 (two) times daily. (Patient taking differently: Take 1 g by mouth 2 (two) times daily as needed. )  . [DISCONTINUED] dicyclomine (BENTYL) 20 MG tablet TAKE 1 TABLET (20 MG TOTAL) BY MOUTH EVERY 6 (SIX) HOURS.  . [DISCONTINUED] lidocaine (XYLOCAINE) 2 % solution Use as directed 5 mLs in the mouth or throat every 4 (four) hours as needed for mouth pain.   No facility-administered encounter medications on file as of 12/10/2018.     Allergies as of 12/10/2018 - Review Complete 12/10/2018  Allergen Reaction Noted  . Doxycycline  05/26/2008  . Penicillins  05/26/2008  . Welchol [colesevelam hcl] Rash 06/03/2018    Past Medical History:  Diagnosis Date  . Allergy   . Anxiety   . Arthritis   . Depression   . GERD (gastroesophageal reflux disease)   . Lymphocytic colitis   . Panic disorder     Past Surgical History:  Procedure Laterality Date  . COLONOSCOPY    . ESOPHAGOGASTRODUODENOSCOPY ENDOSCOPY    . thumb surgery Right     Family History  Problem Relation Age of Onset  . Breast cancer Mother   .  Ulcerative colitis Mother   . Heart disease Mother   . Esophagitis Mother   . Colon cancer Maternal Grandmother   . Heart disease Other        grandfather  . Heart disease Other        uncle  . Irritable bowel syndrome Sister   . Colon polyps Neg Hx   . Diabetes Neg Hx   . Esophageal cancer Neg Hx   . Kidney disease Neg Hx   . Gallbladder disease Neg Hx   . Stomach cancer Neg Hx   . Rectal cancer Neg Hx     Social History   Socioeconomic History  . Marital status: Married    Spouse name: Not on file  . Number  of children: 0  . Years of education: Not on file  . Highest education level: Not on file  Occupational History  . Occupation: Secretary/retired  Social Needs  . Financial resource strain: Not on file  . Food insecurity    Worry: Not on file    Inability: Not on file  . Transportation needs    Medical: Not on file    Non-medical: Not on file  Tobacco Use  . Smoking status: Former Smoker    Types: Cigarettes    Quit date: 01/15/1981    Years since quitting: 37.9  . Smokeless tobacco: Never Used  Substance and Sexual Activity  . Alcohol use: Yes    Alcohol/week: 0.0 standard drinks    Comment: Occassionally  . Drug use: No  . Sexual activity: Not on file  Lifestyle  . Physical activity    Days per week: Not on file    Minutes per session: Not on file  . Stress: Not on file  Relationships  . Social Herbalist on phone: Not on file    Gets together: Not on file    Attends religious service: Not on file    Active member of club or organization: Not on file    Attends meetings of clubs or organizations: Not on file    Relationship status: Not on file  . Intimate partner violence    Fear of current or ex partner: Not on file    Emotionally abused: Not on file    Physically abused: Not on file    Forced sexual activity: Not on file  Other Topics Concern  . Not on file  Social History Narrative  . Not on file      Review of systems: Review of Systems  Constitutional: Negative for fever and chills.  HENT: Negative.   Eyes: Negative for blurred vision.  Respiratory: Negative for cough, shortness of breath and wheezing.   Cardiovascular: Negative for chest pain and palpitations.  Gastrointestinal: as per HPI Genitourinary: Negative for dysuria, urgency, frequency and hematuria.  Musculoskeletal: Negative for myalgias, back pain and joint pain.  Skin: Negative for itching and rash.  Neurological: Negative for dizziness, tremors, focal weakness, seizures and  loss of consciousness.  Endo/Heme/Allergies: Positive for seasonal allergies.  Psychiatric/Behavioral: Negative for depression, suicidal ideas and hallucinations.  All other systems reviewed and are negative.   Physical Exam: Vitals:   12/10/18 1500  BP: 104/60  Pulse: 99  Temp: 98.6 F (37 C)   Body mass index is 19.66 kg/m. Gen:      No acute distress HEENT:  EOMI, sclera anicteric Neck:     No masses; no thyromegaly Lungs:    Clear to auscultation bilaterally; normal respiratory effort  CV:         Regular rate and rhythm; no murmurs Abd:      + bowel sounds; soft, non-tender; no palpable masses, no distension Ext:    No edema; adequate peripheral perfusion Skin:      Warm and dry; no rash Neuro: alert and oriented x 3 Psych: normal mood and affect  Data Reviewed:  Reviewed labs, radiology imaging, old records and pertinent past GI work up   Assessment and Plan/Recommendations:  63 year old female with complaints of persistent dysphagia and globus sensation. Based on her history, seems oropharyngeal dysphagia is likely etiology She has deep vallecula and pyriform recess, observed during EGD.  Possible pooling in pyriform recess and micro aspiration is exacerbating her symptoms.  Advised patient to do chin tuck whenever she tries to swallow any liquids and also with food  Continue antireflux measures  We will schedule for SLP FEES study to further evaluate oropharyngeal dysphagia  Constipation: Increase dietary fiber and fluid intake Trial of low-dose Linzess 72 mcg daily  Return in 4 to 6 weeks or sooner if needed  25 minutes was spent face-to-face with the patient. Greater than 50% of the time used for counseling as well as treatment plan and follow-up. She had multiple questions which were answered to her satisfaction  K. Denzil Magnuson , MD    CC: Lajean Manes, MD

## 2018-12-15 ENCOUNTER — Encounter: Payer: Self-pay | Admitting: Gastroenterology

## 2018-12-30 ENCOUNTER — Ambulatory Visit (HOSPITAL_COMMUNITY): Payer: BC Managed Care – PPO

## 2019-01-06 ENCOUNTER — Ambulatory Visit (HOSPITAL_COMMUNITY)
Admission: RE | Admit: 2019-01-06 | Discharge: 2019-01-06 | Disposition: A | Discharge status: Home / Self Care | Payer: BC Managed Care – PPO | Source: Ambulatory Visit | Attending: Gastroenterology | Admitting: Gastroenterology

## 2019-01-06 ENCOUNTER — Other Ambulatory Visit: Payer: Self-pay

## 2019-01-06 ENCOUNTER — Encounter (HOSPITAL_COMMUNITY): Payer: BC Managed Care – PPO

## 2019-01-06 DIAGNOSIS — J029 Acute pharyngitis, unspecified: Secondary | ICD-10-CM

## 2019-01-06 DIAGNOSIS — R05 Cough: Secondary | ICD-10-CM | POA: Insufficient documentation

## 2019-01-06 DIAGNOSIS — R1312 Dysphagia, oropharyngeal phase: Secondary | ICD-10-CM | POA: Insufficient documentation

## 2019-01-06 DIAGNOSIS — R059 Cough, unspecified: Secondary | ICD-10-CM

## 2019-01-07 ENCOUNTER — Telehealth: Payer: Self-pay | Admitting: Gastroenterology

## 2019-01-07 ENCOUNTER — Other Ambulatory Visit: Payer: Self-pay

## 2019-01-07 MED ORDER — FLUCONAZOLE 100 MG PO TABS: 100.0000 mg | ORAL_TABLET | Freq: Every day | ORAL | 0 refills | Status: AC

## 2019-01-07 NOTE — Telephone Encounter (Signed)
I do not see the report either maybe she just had it and it is not finalized yet.  Await the report. Okay to send prescription for fluconazole 100 mg daily for 3 days.  Thank you

## 2019-01-07 NOTE — Telephone Encounter (Signed)
Patient is advised. Discussed the correct way to take the Diflucan.

## 2019-01-07 NOTE — Telephone Encounter (Signed)
Pt stated that she had a swallow test and was found that she has oral thrush at the base of her tongue.  She inquired whether she can take an anti-fungal supplement and whether thrush is the cause of her swallowing issues.

## 2019-01-07 NOTE — Progress Notes (Signed)
Objective Swallowing Evaluation: Type of Study: FEES-Fiberoptic Endoscopic Evaluation of Swallow   Patient Details  Name: Vicki Jarvis MRN: [id number] Date of Birth: 06-11-1955  Today's Date: 01/07/2019 Time: SLP Start Time (ACUTE ONLY): 1012 -SLP Stop Time (ACUTE ONLY): 1115  SLP Time Calculation (min) (ACUTE ONLY): 63 min   Past Medical History:  Past Medical History:  Diagnosis Date  . Allergy   . Anxiety   . Arthritis   . Depression   . GERD (gastroesophageal reflux disease)   . Lymphocytic colitis   . Panic disorder    Past Surgical History:  Past Surgical History:  Procedure Laterality Date  . COLONOSCOPY    . ESOPHAGOGASTRODUODENOSCOPY ENDOSCOPY    . thumb surgery Right    HPI: 63 yr old seen at [facility] short stay 01/06/19 for FEES procedure. Symptoms began August 2020 per pt and include coughing with liquids (more than solids), increased mucous production after dinner, pharyngeal and esophageal globus sensation, frequent throat clearing, waking at night coughing and inability to lie flat at night. Pt underwent EGD and in report MD noted "she has deep vallecula and pyriform recess, observed during EGD.  Possible pooling in pyriform recess and micro aspiration is exacerbating her symptoms". EDG results revealed no endoscopic abnormality was evident in the esophagus to explain the patient's complaint of dysphagia. It was decided, however, to proceed with dilation of the entire esophagus. Patchy mild inflammation characterized by adherent blood, congestion (edema), erosions and erythema was found in the entire examined stomach. Pt reports taking Nexium in past.   No data recorded   Assessment / Plan / Recommendation  CHL IP CLINICAL IMPRESSIONS 01/07/2019  Clinical Impression Pt seen for outpatient FEES study to evaluate oropharyngeal swallow status. Upon subjective view, her arytenoid cartilidges appeared erythemic, no edema noted and vocal cords normal in appearance. She did  have observable mild amount of standing secretions in pyriform sinuses and interarytenoid space that could be filling and falling into airway resulting in reflexive coughing espisodes during the night. Candidia seen diffusly on lingual base. Her oral phase to manipulate, control and tranist thin and solid boluses was within functional limits. Pharyngealy, timing of epiglottic deflection and protective mechanisms appeared intact to prevent laryngeal penetration or aspiration during this evaluation. In regards to standing secretions in pyriforms and interarytenoid space seen prior to po's, she did not have significant residue that would result in increased risk of aspiration post prandial from that source at least. Her esophagus, pharynx may be in an acute on chronic state of inflammation/hyper sensation with increased mucous production. Motility impairment is possible with globus sensation although she denies solids (meats, breads) "getting stuck." Educated pt on esophageal strategies, most all of which she has been doing to compensate. A barium esophagram may be beneficial to observe movement of barium through esophagus and possible short term reflux medication management per MD advise only.      SLP Visit Diagnosis Dysphagia, unspecified (R13.10)  Attention and concentration deficit following --  Frontal lobe and executive function deficit following --  Impact on safety and function Mild aspiration risk      CHL IP TREATMENT RECOMMENDATION 01/07/2019  Treatment Recommendations No treatment recommended at this time     No flowsheet data found.  CHL IP DIET RECOMMENDATION 01/07/2019  SLP Diet Recommendations Regular solids;Thin liquid  Liquid Administration via Cup;Straw  Medication Administration Whole meds with liquid  Compensations Other (Comment);Follow solids with liquid  Postural Changes Remain semi-upright after after feeds/meals (Comment);Seated upright  at 90 degrees      CHL IP OTHER  RECOMMENDATIONS 01/07/2019  Recommended Consults Other (Comment)  Oral Care Recommendations Oral care BID  Other Recommendations --      CHL IP FOLLOW UP RECOMMENDATIONS 01/07/2019  Follow up Recommendations None      No flowsheet data found.         CHL IP ORAL PHASE 01/07/2019  Oral Phase WFL  Oral - Pudding Teaspoon --  Oral - Pudding Cup --  Oral - Honey Teaspoon --  Oral - Honey Cup --  Oral - Nectar Teaspoon --  Oral - Nectar Cup --  Oral - Nectar Straw --  Oral - Thin Teaspoon --  Oral - Thin Cup --  Oral - Thin Straw --  Oral - Puree --  Oral - Mech Soft --  Oral - Regular --  Oral - Multi-Consistency --  Oral - Pill --  Oral Phase - Comment --    CHL IP PHARYNGEAL PHASE 01/07/2019  Pharyngeal Phase WFL  Pharyngeal- Pudding Teaspoon --  Pharyngeal --  Pharyngeal- Pudding Cup --  Pharyngeal --  Pharyngeal- Honey Teaspoon --  Pharyngeal --  Pharyngeal- Honey Cup --  Pharyngeal --  Pharyngeal- Nectar Teaspoon --  Pharyngeal --  Pharyngeal- Nectar Cup --  Pharyngeal --  Pharyngeal- Nectar Straw --  Pharyngeal --  Pharyngeal- Thin Teaspoon --  Pharyngeal --  Pharyngeal- Thin Cup --  Pharyngeal --  Pharyngeal- Thin Straw --  Pharyngeal --  Pharyngeal- Puree --  Pharyngeal --  Pharyngeal- Mechanical Soft --  Pharyngeal --  Pharyngeal- Regular --  Pharyngeal --  Pharyngeal- Multi-consistency --  Pharyngeal --  Pharyngeal- Pill --  Pharyngeal --  Pharyngeal Comment --     CHL IP CERVICAL ESOPHAGEAL PHASE 01/07/2019  Cervical Esophageal Phase WFL  Pudding Teaspoon --  Pudding Cup --  Honey Teaspoon --  Honey Cup --  Nectar Teaspoon --  Nectar Cup --  Nectar Straw --  Thin Teaspoon --  Thin Cup --  Thin Straw --  Puree --  Mechanical Soft --  Regular --  Multi-consistency --  Pill --  Cervical Esophageal Comment --     Houston Siren 01/07/2019, 1:37 PM   Orbie Pyo Mariah Gerstenberger M.Ed Chief Technology Officer [phone] Office [phone]

## 2019-01-07 NOTE — Telephone Encounter (Signed)
I do not have a report from the FEES. I did not receive a call report about any issues either. Do you have anything on her study? It was yesterday.

## 2019-01-10 ENCOUNTER — Other Ambulatory Visit: Payer: Self-pay

## 2019-01-10 ENCOUNTER — Telehealth: Payer: Self-pay | Admitting: Gastroenterology

## 2019-01-10 MED ORDER — CLOTRIMAZOLE 10 MG MT TROC: 10.0000 mg | Freq: Every day | OROMUCOSAL | 0 refills | Status: AC

## 2019-01-10 NOTE — Telephone Encounter (Signed)
Try Mycelex troche.  Suck on one 5 times daily for 1 week; dispense 35; no refills.  If still having problems in 1 week, Dr. Silverio Decamp will be back for further recommendations.  Thanks

## 2019-01-10 NOTE — Telephone Encounter (Signed)
Doctor of the Day  Patient of Dr Silverio Decamp who was given Diflucan 100 mg x 3 days on 01/06/19 for predsumed oral candidiasis. She had a FEES and was told by the speech pathologist.  Calls today with complaints of the continuing sensation of "something stuck" in her throat, mouth feels raw and her tongue is discolored.  Please advise.

## 2019-01-10 NOTE — Telephone Encounter (Signed)
Patient is instructed and agrees to this plan of care. Rx to the pharmacy of her choice.

## 2019-01-12 NOTE — Telephone Encounter (Signed)
Can you please request the FEES study report, I am unable to find it in epic? Thanks

## 2019-01-12 NOTE — Telephone Encounter (Signed)
Called to [facility] at [phone]. No answer. Had to leave a message. Requested study and a call back to verify.

## 2019-01-17 ENCOUNTER — Telehealth: Payer: Self-pay | Admitting: Gastroenterology

## 2019-01-17 NOTE — Telephone Encounter (Signed)
Pt called to inform that today is her last day on Mycelex and she has not noticed any remarkable improvement. She states that her mouth and esophagus feels the same, her tongue is white and she still has swallowing issues. She said that when she took Diflucan, it helped with her bms as they became normal but once she stopped it the constipation returned. She wants to know if she could try Diflucan again for 3 weeks instead of the 3 days that she took it the first time. Pls call her.

## 2019-01-17 NOTE — Telephone Encounter (Signed)
Ok please send Rx for Diflucan 100mg  daily X 3 weeks with no refill. Schedule follow up office visit. Thanks

## 2019-01-17 NOTE — Telephone Encounter (Signed)
Dr. Silverio Decamp, please advise concerning request for more Diflucan per message below.

## 2019-01-18 NOTE — Telephone Encounter (Signed)
[facility] will fax a copy of the report or call back if it will be in Epic.

## 2019-01-19 ENCOUNTER — Other Ambulatory Visit: Payer: Self-pay

## 2019-01-19 MED ORDER — FLUCONAZOLE 100 MG PO TABS: 100.0000 mg | ORAL_TABLET | Freq: Every day | ORAL | 0 refills | Status: AC

## 2019-01-19 NOTE — Telephone Encounter (Signed)
Report is in your box. Also it is now in [place].

## 2019-01-19 NOTE — Telephone Encounter (Signed)
Pt calling regarding below message

## 2019-01-25 ENCOUNTER — Telehealth: Payer: Self-pay | Admitting: Gastroenterology

## 2019-01-25 NOTE — Telephone Encounter (Signed)
Ok thanks 

## 2019-01-25 NOTE — Telephone Encounter (Signed)
Patient is taking the Diflucan 100 mg daily. States she feels she may have done better on Mycelex troches. However upon further questioning, she is not worse and has not developed new symptoms. She does continue to have a sensation of mucous at the back of her throat that she is unable to clear. Her tongue is more pink and less "coated." She complains of constipation unrelieved by daily Colace stool softener. Encouraged to use Miralax and increase her fluid intake. Appointment scheduled for Friday 01/28/19 at [place].

## 2019-01-25 NOTE — Telephone Encounter (Signed)
Patient is calling- states that she is still having the same systems as last week that are still continuing- states that she has a  fungal infection- constipation, feels like something is stuck in her throat all the time, salvia is really thick and to much- feels like these systems have gotten worse. Asking if there is anything stronger she can try or just stay on the medication that was prescribed until they run out.

## 2019-01-28 ENCOUNTER — Ambulatory Visit: Payer: BC Managed Care – PPO | Admitting: Gastroenterology

## 2019-01-28 ENCOUNTER — Other Ambulatory Visit: Payer: Self-pay

## 2019-01-28 ENCOUNTER — Encounter: Payer: Self-pay | Admitting: Gastroenterology

## 2019-01-28 VITALS — BP 112/64 | HR 73 | Temp 98.7°F | Ht 63.0 in | Wt 105.4 lb

## 2019-01-28 DIAGNOSIS — B370 Candidal stomatitis: Secondary | ICD-10-CM

## 2019-01-28 DIAGNOSIS — R198 Other specified symptoms and signs involving the digestive system and abdomen: Secondary | ICD-10-CM

## 2019-01-28 DIAGNOSIS — R09A2 Foreign body sensation, throat: Secondary | ICD-10-CM

## 2019-01-28 DIAGNOSIS — R0989 Other specified symptoms and signs involving the circulatory and respiratory systems: Secondary | ICD-10-CM

## 2019-01-28 NOTE — Patient Instructions (Addendum)

## 2019-01-28 NOTE — Progress Notes (Signed)
or

## 2019-01-31 ENCOUNTER — Encounter: Payer: Self-pay | Admitting: Gastroenterology

## 2019-01-31 NOTE — Progress Notes (Signed)
Vicki Jarvis    [id number]    1955-10-29  Primary Care Physician:Stoneking, Christiane Ha, MD  Referring Physician: Lajean Manes, MD [address] [place],  [place] [address]   Chief complaint: Dysphagia, globus sensation HPI:  64 year old female here for follow-up visit with persistent globus sensation and dysphagia, is worse with liquids usually post dinner.  She goes into coughing fits.  She has sensation to keep swallowing multiple times.  Somewhat better after she was treated with Diflucan and clotrimazole lozenges.  She felt her symptoms to recur she was off Diflucan and clotrimazole and is currently on 3 weeks course of Diflucan  She is severely restricting her diet and avoiding all sugars, is worried of recurrent yeast infection.  She is continuing to lose weight.  She is no longer constipated, is having a bowel movement once every other day or daily.  Fees study January 07, 2019 showed erythematous arytenoid cartilages, normal vocal cords mild standing secretions in pyriform sinuses and interarytenoid space which could possibly be filling with secretions and possible aspiration and airway causing reflexive coughing episodes during the night Candida diffusely on lingual base  EGD October 06, 2018: Esophagus appeared normal with no abnormality to explain dysphagia.  Empirically dilated with Maloney bougie dilator to 24 [place], showed mild mucosal disruption.  Mild gastritis, biopsies negative for H. Pylori  Relevant GI Hx:  CT abdomen pelvis with contrastMay 2020: Negative for any pancreatic cyst or lesions.  Colonoscopy January 2020:Cecal adenomatous polyp removed, random right and left colon biopsies negative for microscopic colitis GI pathogen panel negative Fecal elastase low suggestive of moderate pancreatic insufficiency  Outpatient Encounter Medications as of 01/28/2019  Medication Sig  . colestipol (COLESTID) 1 g tablet Take 1 g by mouth 2  (two) times daily as needed.  Marland Kitchen CREON 36000 units CPEP capsule 2 CAPSULES DURING [facility] MEAL AND 1 CAPSULE DURING [facility]  Marland Kitchen dicyclomine (BENTYL) 20 MG tablet Take 20 mg by mouth every 6 (six) hours as needed for spasms.  . famotidine (PEPCID) 20 MG tablet Take 10 mg by mouth daily.   . fluconazole (DIFLUCAN) 100 MG tablet Take 1 tablet (100 mg total) by mouth daily for 21 days.  . Multiple Vitamin (MULTIVITAMIN) tablet Take 1 tablet by mouth daily.  Marland Kitchen UNABLE TO FIND Pt gets allergy shots once a week  . [DISCONTINUED] sucralfate (CARAFATE) 1 GM/10ML suspension Take 10 mLs (1 g total) by mouth 2 (two) times daily for 14 days.   No facility-administered encounter medications on file as of 01/28/2019.    Allergies as of 01/28/2019 - Review Complete 01/28/2019  Allergen Reaction Noted  . Doxycycline  05/26/2008  . Penicillins  05/26/2008  . Welchol [colesevelam hcl] Rash 06/03/2018    Past Medical History:  Diagnosis Date  . Allergy   . Anxiety   . Arthritis   . Depression   . GERD (gastroesophageal reflux disease)   . Lymphocytic colitis   . Panic disorder     Past Surgical History:  Procedure Laterality Date  . COLONOSCOPY    . ESOPHAGOGASTRODUODENOSCOPY ENDOSCOPY    . thumb surgery Right     Family History  Problem Relation Age of Onset  . Breast cancer Mother   . Ulcerative colitis Mother   . Heart disease Mother   . Esophagitis Mother   . Colon cancer Maternal Grandmother   . Heart disease Other  grandfather  . Heart disease Other        uncle  . Irritable bowel syndrome Sister   . Colon polyps Neg Hx   . Diabetes Neg Hx   . Esophageal cancer Neg Hx   . Kidney disease Neg Hx   . Gallbladder disease Neg Hx   . Stomach cancer Neg Hx   . Rectal cancer Neg Hx     Social History   Socioeconomic History  . Marital status: Married    Spouse name: Not on file  . Number of children: 0  . Years of education: Not on file  . Highest education level: Not on  file  Occupational History  . Occupation: Secretary/retired  Tobacco Use  . Smoking status: Former Smoker    Types: Cigarettes    Quit date: 01/15/1981    Years since quitting: 38.0  . Smokeless tobacco: Never Used  Substance and Sexual Activity  . Alcohol use: Yes    Alcohol/week: 0.0 standard drinks    Comment: Occassionally  . Drug use: No  . Sexual activity: Not on file  Other Topics Concern  . Not on file  Social History Narrative  . Not on file   Social Determinants of Health   Financial Resource Strain:   . Difficulty of Paying Living Expenses: Not on file  Food Insecurity:   . Worried About Charity fundraiser in the Last Year: Not on file  . Ran Out of Food in the Last Year: Not on file  Transportation Needs:   . Lack of Transportation (Medical): Not on file  . Lack of Transportation (Non-Medical): Not on file  Physical Activity:   . Days of Exercise per Week: Not on file  . Minutes of Exercise per Session: Not on file  Stress:   . Feeling of Stress : Not on file  Social Connections:   . Frequency of Communication with Friends and Family: Not on file  . Frequency of Social Gatherings with Friends and Family: Not on file  . Attends Religious Services: Not on file  . Active Member of Clubs or Organizations: Not on file  . Attends Archivist Meetings: Not on file  . Marital Status: Not on file  Intimate Partner Violence:   . Fear of Current or Ex-Partner: Not on file  . Emotionally Abused: Not on file  . Physically Abused: Not on file  . Sexually Abused: Not on file      Review of systems: Review of Systems  Constitutional: Negative for fever and chills.  HENT: Negative.   Eyes: Negative for blurred vision.  Respiratory: Negative for cough, shortness of breath and wheezing.   Cardiovascular: Negative for chest pain and palpitations.  Gastrointestinal: as per HPI Genitourinary: Negative for dysuria, urgency, frequency and hematuria.    Musculoskeletal: Negative for myalgias, back pain and joint pain.  Skin: Negative for itching and rash.  Neurological: Negative for dizziness, tremors, focal weakness, seizures and loss of consciousness.  Endo/Heme/Allergies: Positive for seasonal allergies.  Psychiatric/Behavioral: Negative for depression, suicidal ideas and hallucinations.  All other systems reviewed and are negative.   Physical Exam: Vitals:   01/28/19 1528  BP: 112/64  Pulse: 73  Temp: 98.7 F (37.1 C)  SpO2: 98%   Body mass index is 18.67 kg/m. Gen:      No acute distress HEENT:  EOMI, sclera anicteric Neck:     No masses; no thyromegaly Lungs:    Clear to auscultation bilaterally; normal respiratory effort  CV:         Regular rate and rhythm; no murmurs Abd:      + bowel sounds; soft, non-tender; no palpable masses, no distension Ext:    No edema; adequate peripheral perfusion Skin:      Warm and dry; no rash Neuro: alert and oriented x 3 Psych: normal mood and affect  Data Reviewed:  Reviewed labs, radiology imaging, old records and pertinent past GI work up   Assessment and Plan/Recommendations:  64 year old female with complaints of persistent globus sensation and dysphagia Evidence of oropharyngeal Candida during fees study, currently on 3-week course of oral Diflucan  She is extremely anxious and is restricting her diet significantly I tried to reassure patient and advised her to slowly add back different food categories and increase calorie intake  Also reassured her work-up so far is negative for any neoplastic lesion, stricture or mucosal ulceration  Continue antireflux measures  Continue Creon with meals for pancreatic insufficiency  Advised patient to eat high-calorie high-protein diet  Return in 3 months or sooner if needed   This visit required 30 minutes of patient care (this includes precharting, chart review, review of results, face-to-face time used for counseling as well  as treatment plan and follow-up. The patient was provided an opportunity to ask questions and all were answered. The patient agreed with the plan and demonstrated an understanding of the instructions.  Damaris Hippo , MD    CC: Lajean Manes, MD

## 2019-02-08 ENCOUNTER — Telehealth: Payer: Self-pay | Admitting: Gastroenterology

## 2019-02-08 NOTE — Telephone Encounter (Signed)
Patient is advised. She will return for follow up in April.

## 2019-02-08 NOTE — Telephone Encounter (Signed)
Patient calls today to let you know she is taking the last dosage of Diflucan. She feels she has had improvement. She observes her stools are now a "greenish color" which she interprets to mean "the fungus is coming out of my colon." She is choking less on fluids. She continues to feels she has excess saliva at the back of her throat. Her weight has stabilized. She has added a lot of foods back to her diet. Her question is if she should extend the time on Diflucan?

## 2019-02-08 NOTE — Telephone Encounter (Signed)
I am glad she is feeling better. I do not recommend extending duration of Diflucan . Thanks

## 2019-02-15 ENCOUNTER — Telehealth: Payer: Self-pay | Admitting: Gastroenterology

## 2019-02-15 NOTE — Telephone Encounter (Signed)
Pt stated that she has taken Diflucan for a week and symptoms of dysphagia are worsening.  Pt reported that she "feels mucus in the throat and cannot swallow liquids."

## 2019-02-15 NOTE — Telephone Encounter (Signed)
Spoke with the patient. She states the symptoms of "trouble swallowing liquids" causing her to choke and cough have returned. She states she constantly feels like something is stuck in her throat. Clears her throat a lot. She is taking an expectorant every 4 hours or as the directions indicate. She is not sure if this does anything, but hopes it helps. She is not avoiding drinking PO fluids despite the symptoms. Thanks

## 2019-02-15 NOTE — Telephone Encounter (Signed)
Agree with encouraging patient to continue drinking fluids to avoid dehydration.  Okay to use cough suppressant over-the-counter as needed.  Thanks

## 2019-02-16 NOTE — Telephone Encounter (Signed)
Spoke with the patient. Referral to ENT for further evaluation of the "mucous" in her throat that is choking her.  She tells me today that she continues to have "green bowel movements" and that she is taking supplements for the "yeast in the intestines." Declines a trial off the supplements. States her problems have been caused by the antibiotic she took last year. She feels she should "continue to take Diflucan" and is not clear on why she cannot "other than it may affect my kidneys or liver or something like that. I know what I have." Offered an office visit to discuss options. Patient declines. She tells me she feels we don't listen to her, we are only concerned about her weight. She does agree to consider the ENT referral.

## 2019-02-16 NOTE — Telephone Encounter (Signed)
Spoke with her again. She is much calmer now. She said she just feels frustrated and depressed at the persistence of her symptoms. She declines the ID referral. She would like to see what ENT can do first.  Encouraged to call me again if she needs to discuss this further or has other issues. Thanks me for calling her.

## 2019-02-16 NOTE — Telephone Encounter (Signed)
ok 

## 2019-02-16 NOTE — Telephone Encounter (Signed)
We can refer to ID to discuss her concerns regarding "yeast infection" and if she needs long term Diflucan.  It is not recommended to continue long term Diflucan unless some one is immunosuppressed  Follow up ENT consult.

## 2019-03-18 ENCOUNTER — Other Ambulatory Visit: Payer: Self-pay | Admitting: Geriatric Medicine

## 2019-03-18 DIAGNOSIS — Z1231 Encounter for screening mammogram for malignant neoplasm of breast: Secondary | ICD-10-CM

## 2019-03-29 ENCOUNTER — Other Ambulatory Visit: Payer: Self-pay

## 2019-03-29 ENCOUNTER — Emergency Department
Admission: EM | Admit: 2019-03-29 | Discharge: 2019-03-29 | Disposition: A | Discharge status: Home / Self Care | Payer: BC Managed Care – PPO | Source: Home / Self Care | Attending: Family Medicine | Admitting: Family Medicine

## 2019-03-29 ENCOUNTER — Encounter: Payer: Self-pay | Admitting: Emergency Medicine

## 2019-03-29 DIAGNOSIS — M79645 Pain in left finger(s): Secondary | ICD-10-CM

## 2019-03-29 NOTE — ED Provider Notes (Signed)
Vicki Jarvis    CSN: [id number] Arrival date & time: 03/29/19  1503      History   Chief Complaint Chief Complaint  Patient presents with  . Hand Pain    HPI Vicki Jarvis is a 64 y.o. female.   While changing sheets and pillowcases yesterday, patient developed pain and purplish discoloration in her left 3rd and 4th fingertips, followed by tingling in her fingertip and whitening of the skin.  The symptoms gradually resolved.  She notes that she had washed her hands in cold water just prior.  Today, while walking into our clinic, she had a similar but briefer occurrence in her left 3rd finger, now resolved.  She feels well otherwise.  The history is provided by the patient.  Hand Pain This is a new problem. The current episode started yesterday. Episode frequency: twice. The problem has been resolved. Associated symptoms comments: none. Nothing aggravates the symptoms. Nothing relieves the symptoms. She has tried nothing for the symptoms.    Past Medical History:  Diagnosis Date  . Allergy   . Anxiety   . Arthritis   . Depression   . GERD (gastroesophageal reflux disease)   . Lymphocytic colitis   . Panic disorder     Patient Active Problem List   Diagnosis Date Noted  . PANIC DISORDER 05/26/2008  . COLITIS 05/26/2008  . HEARTBURN 05/26/2008  . ANXIETY DEPRESSION 06/12/2007  . ALLERGIC RHINITIS, CHRONIC 06/12/2007    Past Surgical History:  Procedure Laterality Date  . COLONOSCOPY    . ESOPHAGOGASTRODUODENOSCOPY ENDOSCOPY    . thumb surgery Right     OB History   No obstetric history on file.      Home Medications    Prior to Admission medications   Medication Sig Start Date End Date Taking? Authorizing Provider  colestipol (COLESTID) 1 g tablet Take 1 g by mouth 2 (two) times daily as needed.    [provider]  CREON 36000 units CPEP capsule 2 CAPSULES DURING [facility] MEAL AND 1 CAPSULE DURING [facility]. 11/02/18   Mauri Pole,  MD  dicyclomine (BENTYL) 20 MG tablet Take 20 mg by mouth every 6 (six) hours as needed for spasms.    [provider]  famotidine (PEPCID) 20 MG tablet Take 10 mg by mouth daily.     [provider]  Multiple Vitamin (MULTIVITAMIN) tablet Take 1 tablet by mouth daily.    [provider]  UNABLE TO FIND Pt gets allergy shots once a week    [provider]    Family History Family History  Problem Relation Age of Onset  . Breast cancer Mother   . Ulcerative colitis Mother   . Heart disease Mother   . Esophagitis Mother   . Colon cancer Maternal Grandmother   . Heart disease Other        grandfather  . Heart disease Other        uncle  . Irritable bowel syndrome Sister   . Colon polyps Neg Hx   . Diabetes Neg Hx   . Esophageal cancer Neg Hx   . Kidney disease Neg Hx   . Gallbladder disease Neg Hx   . Stomach cancer Neg Hx   . Rectal cancer Neg Hx     Social History Social History   Tobacco Use  . Smoking status: Former Smoker    Types: Cigarettes    Quit date: 01/15/1981    Years since quitting: 38.2  .  Smokeless tobacco: Never Used  Substance Use Topics  . Alcohol use: Yes    Alcohol/week: 0.0 standard drinks    Comment: Occassionally  . Drug use: No     Allergies   Doxycycline, Penicillins, and Welchol [colesevelam hcl]   Review of Systems Review of Systems  Constitutional: Negative for chills, diaphoresis, fatigue and fever.  HENT: Negative.   Eyes: Negative.   Respiratory: Negative.   Cardiovascular: Negative.   Gastrointestinal: Negative.   Genitourinary: Negative.   Musculoskeletal: Negative.   Skin: Positive for color change and pallor.  Neurological:       Finger tingling     Physical Exam Triage Vital Signs ED Triage Vitals  Enc Vitals Group     BP 03/29/19 1523 (!) 160/92     Pulse Rate 03/29/19 1523 70     Resp --      Temp 03/29/19 1523 97.8 F (36.6 C)     Temp Source 03/29/19 1523 Oral     SpO2  03/29/19 1523 100 %     Weight 03/29/19 1524 108 lb (49 kg)     Height --      Head Circumference --      Peak Flow --      Pain Score 03/29/19 1524 2     Pain Loc --      Pain Edu? --      Excl. in [place]? --    No data found.  Updated Vital Signs BP (!) 160/92 (BP Location: Right Arm)   Pulse 70   Temp 97.8 F (36.6 C) (Oral)   Wt 49 kg   SpO2 100%   BMI 19.13 kg/m   Visual Acuity Right Eye Distance:   Left Eye Distance:   Bilateral Distance:    Right Eye Near:   Left Eye Near:    Bilateral Near:     Physical Exam Vitals and nursing note reviewed.  Constitutional:      General: She is not in acute distress. HENT:     Head: Normocephalic.  Eyes:     Pupils: Pupils are equal, round, and reactive to light.  Cardiovascular:     Rate and Rhythm: Normal rate.  Pulmonary:     Effort: Pulmonary effort is normal.  Musculoskeletal:     Left hand: No swelling, deformity, tenderness or bony tenderness. Normal range of motion. Normal sensation. Normal capillary refill. Normal pulse.     Comments: All fingers of left hand appear normal without tenderness to palpation or decrease in cap refill.  Skin:    General: Skin is warm and dry.  Neurological:     General: No focal deficit present.     Mental Status: She is alert.      UC Treatments / Results  Labs (all labs ordered are listed, but only abnormal results are displayed) Labs Reviewed - No data to display  EKG   Radiology No results found.  Procedures Procedures (including critical Jarvis time)  Medications Ordered in UC Medications - No data to display  Initial Impression / Assessment and Plan / UC Course  I have reviewed the triage vital signs and the nursing notes.  Pertinent labs & imaging results that were available during my Jarvis of the patient were reviewed by me and considered in my medical decision making (see chart for details).    Normal exam now.  Symptoms are suggestive of Raynaud's syndrome.   If symptoms worsen or become more frequent, recommend that she follow-up with  her PCP for further evaluation   Final Clinical Impressions(s) / UC Diagnoses   Final diagnoses:  Finger pain, left     Discharge Instructions     Keep hands warm when outside in cold weather.   ED Prescriptions    None        Kandra Nicolas, MD 03/29/19 [phone]

## 2019-03-29 NOTE — ED Triage Notes (Signed)
Pt states yesterday her left hand started hurting. She has some bruising on her fingers. Denies injury.

## 2019-03-29 NOTE — Discharge Instructions (Addendum)
Keep hands warm when outside in cold weather.

## 2019-04-11 ENCOUNTER — Other Ambulatory Visit: Payer: Self-pay | Admitting: Gastroenterology

## 2019-04-19 ENCOUNTER — Ambulatory Visit
Admission: RE | Admit: 2019-04-19 | Discharge: 2019-04-19 | Disposition: A | Discharge status: Home / Self Care | Payer: BC Managed Care – PPO | Source: Ambulatory Visit | Attending: Geriatric Medicine | Admitting: Geriatric Medicine

## 2019-04-19 ENCOUNTER — Other Ambulatory Visit: Payer: Self-pay

## 2019-04-19 DIAGNOSIS — Z1231 Encounter for screening mammogram for malignant neoplasm of breast: Secondary | ICD-10-CM

## 2019-08-30 ENCOUNTER — Other Ambulatory Visit: Payer: Self-pay | Admitting: Gastroenterology

## 2020-01-25 ENCOUNTER — Other Ambulatory Visit: Payer: Self-pay | Admitting: Gastroenterology

## 2020-06-15 ENCOUNTER — Other Ambulatory Visit: Payer: Self-pay | Admitting: Geriatric Medicine

## 2020-06-15 DIAGNOSIS — Z1231 Encounter for screening mammogram for malignant neoplasm of breast: Secondary | ICD-10-CM

## 2020-07-05 ENCOUNTER — Other Ambulatory Visit: Payer: Self-pay | Admitting: Gastroenterology

## 2020-08-13 ENCOUNTER — Other Ambulatory Visit: Payer: Self-pay

## 2020-08-13 ENCOUNTER — Ambulatory Visit
Admission: RE | Admit: 2020-08-13 | Discharge: 2020-08-13 | Disposition: A | Discharge status: Home / Self Care | Payer: BC Managed Care – PPO | Source: Ambulatory Visit | Attending: Geriatric Medicine | Admitting: Geriatric Medicine

## 2020-08-13 DIAGNOSIS — Z1231 Encounter for screening mammogram for malignant neoplasm of breast: Secondary | ICD-10-CM

## 2020-12-09 ENCOUNTER — Other Ambulatory Visit: Payer: Self-pay | Admitting: Gastroenterology

## 2020-12-10 ENCOUNTER — Other Ambulatory Visit: Payer: Self-pay | Admitting: Gastroenterology

## 2020-12-11 ENCOUNTER — Telehealth: Payer: Self-pay | Admitting: Physician Assistant

## 2020-12-11 MED ORDER — PANCRELIPASE (LIP-PROT-AMYL) 36000-114000 UNITS PO CPEP: ORAL_CAPSULE | ORAL | 4 refills | Status: DC

## 2020-12-11 NOTE — Telephone Encounter (Signed)
Called patient and informed Vicki Jarvis sent to her phamracy and she needs to keep her follow up with [name]

## 2020-12-11 NOTE — Telephone Encounter (Signed)
Vicki Jarvis, patient last saw Dr. Silverio Decamp. This RX refill request will need to be sent to her CMA, Robin. I have routed this to her. Thanks

## 2020-12-11 NOTE — Telephone Encounter (Signed)
Patient called requesting a refill for Creon.  States she will be out and needs it for the weekend.  She was also told she needed to make an appointment, which she did for 01/08/21 at 2:30 p.m.  Can you please call in enough medication to get her through until this appointment?  If so, please call patient and let her know.  Thank you.

## 2021-01-03 ENCOUNTER — Other Ambulatory Visit: Payer: Self-pay

## 2021-01-03 DIAGNOSIS — J3081 Allergic rhinitis due to animal (cat) (dog) hair and dander: Secondary | ICD-10-CM | POA: Insufficient documentation

## 2021-01-03 DIAGNOSIS — J301 Allergic rhinitis due to pollen: Secondary | ICD-10-CM | POA: Insufficient documentation

## 2021-01-08 ENCOUNTER — Ambulatory Visit: Payer: BC Managed Care – PPO | Admitting: Physician Assistant

## 2021-01-08 ENCOUNTER — Encounter: Payer: Self-pay | Admitting: Physician Assistant

## 2021-01-08 VITALS — BP 138/84 | HR 82 | Ht 63.0 in | Wt 111.0 lb

## 2021-01-08 DIAGNOSIS — K8689 Other specified diseases of pancreas: Secondary | ICD-10-CM

## 2021-01-08 MED ORDER — PANCRELIPASE (LIP-PROT-AMYL) 36000-114000 UNITS PO CPEP: ORAL_CAPSULE | ORAL | 3 refills | Status: DC

## 2021-01-08 NOTE — Patient Instructions (Signed)
If you are age 64 or younger, your body mass index should be between 19-25. Your Body mass index is 19.66 kg/m. If this is out of the aformentioned range listed, please consider follow up with your Primary Care Provider.   The [facility] providers would like to encourage you to use [facility] to communicate with providers for non-urgent requests or questions.  Due to long hold times on the telephone, sending your provider a message by [facility] may be faster and more efficient way to get a response. Please allow 48 business hours for a response.  Please remember that this is for non-urgent requests/questions.  We have refilled your medication for a year. Contact your pharmacy for refills.  Return for follow up in 2 years or sooner if needed.  It was great seeing you today! Thank you for entrusting me with your care and choosing [facility]  Ellouise Newer, [place]

## 2021-01-08 NOTE — Progress Notes (Signed)
Chief Complaint: Medication refill  HPI:    Vicki Jarvis is a 65 year old Caucasian female with a past medical history as listed below including anxiety, depression, pancreatic insufficiency, GERD and lymphocytic colitis, known to Dr. Silverio Decamp, who presents to clinic today for refill of her Creon.    01/25/2018 colonoscopy with a 5 mm polyp and mild patchy erythema in the rectum nonbleeding internal hemorrhoids.  Biopsies negative for microscopic colitis.  Repeat recommended 5 years due to tubular adenoma.    Today, patient explains that after being seen by Dr. Silverio Decamp for cough etc. she was seen by ENT and diagnosed with reflux and started on Pantoprazole 40 mg daily which she took for a year and it helped all of her symptoms.  She also changed her diet a little bit and is eating 4 small meals a day instead of 2 or 3 larger ones.  This has greatly helped her.  In general she is feeling wonderful.  She still takes her Creon 2 with a meal and 1 with a snack.  She has no further loose stools.    Denies fever, chills, weight loss, blood in her stool or symptoms that awaken her from sleep.  Past Medical History:  Diagnosis Date   Allergy    Anxiety    Arthritis    Depression    GERD (gastroesophageal reflux disease)    Lymphocytic colitis    Panic disorder     Past Surgical History:  Procedure Laterality Date   COLONOSCOPY     ESOPHAGOGASTRODUODENOSCOPY ENDOSCOPY     thumb surgery Right     Current Outpatient Medications  Medication Sig Dispense Refill   celecoxib (CELEBREX) 100 MG capsule 100 mg.     cetirizine (ZYRTEC) 10 MG tablet Take 10 mg by mouth.     colestipol (COLESTID) 1 g tablet Take 1 g by mouth 2 (two) times daily as needed.     CREON 36000-114000 units CPEP capsule 2 CAPSULES DURING [facility] MEAL AND 1 CAPSULE DURING [facility]. 200 capsule 4   dicyclomine (BENTYL) 20 MG tablet Take 20 mg by mouth every 6 (six) hours as needed for spasms.     EPINEPHrine 0.3 mg/0.3 mL IJ SOAJ  injection Inject into the muscle as directed.     famotidine (PEPCID) 20 MG tablet Take 10 mg by mouth daily.      lipase/protease/amylase (CREON) 36000 UNITS CPEP capsule 2 CAPSULES DURING [facility] MEAL AND 1 CAPSULE DURING [facility]. 200 capsule 4   Multiple Vitamin (MULTIVITAMIN) tablet Take 1 tablet by mouth daily.     pantoprazole (PROTONIX) 40 MG tablet 40 mg.     UNABLE TO FIND Pt gets allergy shots once a week     No current facility-administered medications for this visit.    Allergies as of 01/08/2021 - Review Complete 03/29/2019  Allergen Reaction Noted   Doxycycline  05/26/2008   Penicillins  05/26/2008   Welchol [colesevelam hcl] Rash 06/03/2018    Family History  Problem Relation Age of Onset   Breast cancer Mother    Ulcerative colitis Mother    Heart disease Mother    Esophagitis Mother    Colon cancer Maternal Grandmother    Heart disease Other        grandfather   Heart disease Other        uncle   Irritable bowel syndrome Sister    Colon polyps Neg Hx    Diabetes Neg Hx    Esophageal cancer Neg  Hx    Kidney disease Neg Hx    Gallbladder disease Neg Hx    Stomach cancer Neg Hx    Rectal cancer Neg Hx     Social History   Socioeconomic History   Marital status: Married    Spouse name: Not on file   Number of children: 0   Years of education: Not on file   Highest education level: Not on file  Occupational History   Occupation: Secretary/retired  Tobacco Use   Smoking status: Former    Types: Cigarettes    Quit date: 01/15/1981    Years since quitting: 40.0   Smokeless tobacco: Never  Vaping Use   Vaping Use: Never used  Substance and Sexual Activity   Alcohol use: Yes    Alcohol/week: 0.0 standard drinks    Comment: Occassionally   Drug use: No   Sexual activity: Not on file  Other Topics Concern   Not on file  Social History Narrative   Not on file   Social Determinants of Health   Financial Resource Strain: Not on file  Food  Insecurity: Not on file  Transportation Needs: Not on file  Physical Activity: Not on file  Stress: Not on file  Social Connections: Not on file  Intimate Partner Violence: Not on file    Review of Systems:    Constitutional: No weight loss, fever or chills Cardiovascular: No chest pain Respiratory: No SOB  Gastrointestinal: See HPI and otherwise negative   Physical Exam:  Vital signs: BP 138/84    Pulse 82    Ht 5\' 3"  (1.6 m)    Wt 111 lb (50.3 kg)    BMI 19.66 kg/m    Constitutional:   Pleasant Caucasian female appears to be in NAD, Well developed, Well nourished, alert and cooperative Respiratory: Respirations even and unlabored. Lungs clear to auscultation bilaterally.   No wheezes, crackles, or rhonchi.  Cardiovascular: Normal S1, S2. No MRG. Regular rate and rhythm. No peripheral edema, cyanosis or pallor.  Gastrointestinal:  Soft, nondistended, nontender. No rebound or guarding. Normal bowel sounds. No appreciable masses or hepatomegaly. Rectal:  Not performed.  Psychiatric: Oriented to person, place and time. Demonstrates good judgement and reason without abnormal affect or behaviors.  No recent labs or imaging.  Assessment: 1.  Pancreatic insufficiency: Better on Creon 2 with a meal and 1 with a snack  Plan: 1.  Refilled patient's Creon 2 with a meal and 1 with a snack.  Refill x11. 2.  Discussed with patient that she does not need to be seen here for another 2 years if she is doing well.  When her refill runs out in a year we can refill again for another year.  Vicki Newer, PA-C [facility] 01/08/2021, 2:18 PM  Cc: Lajean Manes, MD

## 2021-02-06 NOTE — Progress Notes (Signed)
Reviewed and agree with documentation and assessment and plan. K. Veena Dermot Gremillion , MD   

## 2021-06-24 DIAGNOSIS — J301 Allergic rhinitis due to pollen: Secondary | ICD-10-CM | POA: Diagnosis not present

## 2021-06-24 DIAGNOSIS — J3089 Other allergic rhinitis: Secondary | ICD-10-CM | POA: Diagnosis not present

## 2021-06-26 DIAGNOSIS — G629 Polyneuropathy, unspecified: Secondary | ICD-10-CM | POA: Diagnosis not present

## 2021-06-27 DIAGNOSIS — G629 Polyneuropathy, unspecified: Secondary | ICD-10-CM | POA: Diagnosis not present

## 2021-06-27 DIAGNOSIS — G5603 Carpal tunnel syndrome, bilateral upper limbs: Secondary | ICD-10-CM | POA: Diagnosis not present

## 2021-07-01 DIAGNOSIS — J3081 Allergic rhinitis due to animal (cat) (dog) hair and dander: Secondary | ICD-10-CM | POA: Diagnosis not present

## 2021-07-01 DIAGNOSIS — J3089 Other allergic rhinitis: Secondary | ICD-10-CM | POA: Diagnosis not present

## 2021-07-01 DIAGNOSIS — J301 Allergic rhinitis due to pollen: Secondary | ICD-10-CM | POA: Diagnosis not present

## 2021-07-08 ENCOUNTER — Other Ambulatory Visit: Payer: Self-pay | Admitting: Geriatric Medicine

## 2021-07-08 DIAGNOSIS — J3089 Other allergic rhinitis: Secondary | ICD-10-CM | POA: Diagnosis not present

## 2021-07-08 DIAGNOSIS — J301 Allergic rhinitis due to pollen: Secondary | ICD-10-CM | POA: Diagnosis not present

## 2021-07-08 DIAGNOSIS — Z1231 Encounter for screening mammogram for malignant neoplasm of breast: Secondary | ICD-10-CM

## 2021-07-15 DIAGNOSIS — J301 Allergic rhinitis due to pollen: Secondary | ICD-10-CM | POA: Diagnosis not present

## 2021-07-15 DIAGNOSIS — J3089 Other allergic rhinitis: Secondary | ICD-10-CM | POA: Diagnosis not present

## 2021-07-15 DIAGNOSIS — J3081 Allergic rhinitis due to animal (cat) (dog) hair and dander: Secondary | ICD-10-CM | POA: Diagnosis not present

## 2021-07-16 DIAGNOSIS — I7300 Raynaud's syndrome without gangrene: Secondary | ICD-10-CM | POA: Diagnosis not present

## 2021-07-16 DIAGNOSIS — Z0000 Encounter for general adult medical examination without abnormal findings: Secondary | ICD-10-CM | POA: Diagnosis not present

## 2021-07-16 DIAGNOSIS — K219 Gastro-esophageal reflux disease without esophagitis: Secondary | ICD-10-CM | POA: Diagnosis not present

## 2021-07-16 DIAGNOSIS — E7800 Pure hypercholesterolemia, unspecified: Secondary | ICD-10-CM | POA: Diagnosis not present

## 2021-07-16 DIAGNOSIS — Z79899 Other long term (current) drug therapy: Secondary | ICD-10-CM | POA: Diagnosis not present

## 2021-07-16 DIAGNOSIS — M810 Age-related osteoporosis without current pathological fracture: Secondary | ICD-10-CM | POA: Diagnosis not present

## 2021-07-16 DIAGNOSIS — E222 Syndrome of inappropriate secretion of antidiuretic hormone: Secondary | ICD-10-CM | POA: Diagnosis not present

## 2021-07-16 DIAGNOSIS — Z23 Encounter for immunization: Secondary | ICD-10-CM | POA: Diagnosis not present

## 2021-07-18 DIAGNOSIS — M79641 Pain in right hand: Secondary | ICD-10-CM | POA: Diagnosis not present

## 2021-07-18 DIAGNOSIS — G5603 Carpal tunnel syndrome, bilateral upper limbs: Secondary | ICD-10-CM | POA: Diagnosis not present

## 2021-07-18 DIAGNOSIS — M79642 Pain in left hand: Secondary | ICD-10-CM | POA: Diagnosis not present

## 2021-07-24 DIAGNOSIS — J3081 Allergic rhinitis due to animal (cat) (dog) hair and dander: Secondary | ICD-10-CM | POA: Diagnosis not present

## 2021-07-24 DIAGNOSIS — J3089 Other allergic rhinitis: Secondary | ICD-10-CM | POA: Diagnosis not present

## 2021-07-24 DIAGNOSIS — J301 Allergic rhinitis due to pollen: Secondary | ICD-10-CM | POA: Diagnosis not present

## 2021-07-29 DIAGNOSIS — Z79899 Other long term (current) drug therapy: Secondary | ICD-10-CM | POA: Diagnosis not present

## 2021-07-29 DIAGNOSIS — J3081 Allergic rhinitis due to animal (cat) (dog) hair and dander: Secondary | ICD-10-CM | POA: Diagnosis not present

## 2021-07-29 DIAGNOSIS — J3089 Other allergic rhinitis: Secondary | ICD-10-CM | POA: Diagnosis not present

## 2021-07-29 DIAGNOSIS — J301 Allergic rhinitis due to pollen: Secondary | ICD-10-CM | POA: Diagnosis not present

## 2021-08-05 DIAGNOSIS — J301 Allergic rhinitis due to pollen: Secondary | ICD-10-CM | POA: Diagnosis not present

## 2021-08-05 DIAGNOSIS — J3089 Other allergic rhinitis: Secondary | ICD-10-CM | POA: Diagnosis not present

## 2021-08-05 DIAGNOSIS — J3081 Allergic rhinitis due to animal (cat) (dog) hair and dander: Secondary | ICD-10-CM | POA: Diagnosis not present

## 2021-08-12 DIAGNOSIS — J301 Allergic rhinitis due to pollen: Secondary | ICD-10-CM | POA: Diagnosis not present

## 2021-08-12 DIAGNOSIS — J3089 Other allergic rhinitis: Secondary | ICD-10-CM | POA: Diagnosis not present

## 2021-08-14 ENCOUNTER — Ambulatory Visit
Admission: RE | Admit: 2021-08-14 | Discharge: 2021-08-14 | Disposition: A | Discharge status: Home / Self Care | Payer: Medicare PPO | Source: Ambulatory Visit | Attending: Geriatric Medicine | Admitting: Geriatric Medicine

## 2021-08-14 DIAGNOSIS — Z1231 Encounter for screening mammogram for malignant neoplasm of breast: Secondary | ICD-10-CM | POA: Diagnosis not present

## 2021-08-15 DIAGNOSIS — G5603 Carpal tunnel syndrome, bilateral upper limbs: Secondary | ICD-10-CM | POA: Diagnosis not present

## 2021-08-15 DIAGNOSIS — R29898 Other symptoms and signs involving the musculoskeletal system: Secondary | ICD-10-CM | POA: Diagnosis not present

## 2021-08-19 DIAGNOSIS — J3081 Allergic rhinitis due to animal (cat) (dog) hair and dander: Secondary | ICD-10-CM | POA: Diagnosis not present

## 2021-08-19 DIAGNOSIS — J301 Allergic rhinitis due to pollen: Secondary | ICD-10-CM | POA: Diagnosis not present

## 2021-08-19 DIAGNOSIS — J3089 Other allergic rhinitis: Secondary | ICD-10-CM | POA: Diagnosis not present

## 2021-08-26 DIAGNOSIS — J301 Allergic rhinitis due to pollen: Secondary | ICD-10-CM | POA: Diagnosis not present

## 2021-08-26 DIAGNOSIS — J3089 Other allergic rhinitis: Secondary | ICD-10-CM | POA: Diagnosis not present

## 2021-08-31 ENCOUNTER — Other Ambulatory Visit: Payer: Self-pay | Admitting: Gastroenterology

## 2021-09-02 DIAGNOSIS — J3089 Other allergic rhinitis: Secondary | ICD-10-CM | POA: Diagnosis not present

## 2021-09-02 DIAGNOSIS — J3081 Allergic rhinitis due to animal (cat) (dog) hair and dander: Secondary | ICD-10-CM | POA: Diagnosis not present

## 2021-09-02 DIAGNOSIS — J301 Allergic rhinitis due to pollen: Secondary | ICD-10-CM | POA: Diagnosis not present

## 2021-09-02 NOTE — Telephone Encounter (Signed)
Patient needs a follow up appointment.

## 2021-09-09 DIAGNOSIS — J3081 Allergic rhinitis due to animal (cat) (dog) hair and dander: Secondary | ICD-10-CM | POA: Diagnosis not present

## 2021-09-09 DIAGNOSIS — J301 Allergic rhinitis due to pollen: Secondary | ICD-10-CM | POA: Diagnosis not present

## 2021-09-09 DIAGNOSIS — J3089 Other allergic rhinitis: Secondary | ICD-10-CM | POA: Diagnosis not present

## 2021-09-12 DIAGNOSIS — G5603 Carpal tunnel syndrome, bilateral upper limbs: Secondary | ICD-10-CM | POA: Diagnosis not present

## 2021-09-16 DIAGNOSIS — J3089 Other allergic rhinitis: Secondary | ICD-10-CM | POA: Diagnosis not present

## 2021-09-16 DIAGNOSIS — J301 Allergic rhinitis due to pollen: Secondary | ICD-10-CM | POA: Diagnosis not present

## 2021-09-16 DIAGNOSIS — J3081 Allergic rhinitis due to animal (cat) (dog) hair and dander: Secondary | ICD-10-CM | POA: Diagnosis not present

## 2021-09-24 DIAGNOSIS — J3081 Allergic rhinitis due to animal (cat) (dog) hair and dander: Secondary | ICD-10-CM | POA: Diagnosis not present

## 2021-09-24 DIAGNOSIS — J3089 Other allergic rhinitis: Secondary | ICD-10-CM | POA: Diagnosis not present

## 2021-09-24 DIAGNOSIS — J301 Allergic rhinitis due to pollen: Secondary | ICD-10-CM | POA: Diagnosis not present

## 2021-09-30 DIAGNOSIS — J301 Allergic rhinitis due to pollen: Secondary | ICD-10-CM | POA: Diagnosis not present

## 2021-09-30 DIAGNOSIS — J3089 Other allergic rhinitis: Secondary | ICD-10-CM | POA: Diagnosis not present

## 2021-10-07 DIAGNOSIS — J3081 Allergic rhinitis due to animal (cat) (dog) hair and dander: Secondary | ICD-10-CM | POA: Diagnosis not present

## 2021-10-07 DIAGNOSIS — J301 Allergic rhinitis due to pollen: Secondary | ICD-10-CM | POA: Diagnosis not present

## 2021-10-07 DIAGNOSIS — J3089 Other allergic rhinitis: Secondary | ICD-10-CM | POA: Diagnosis not present

## 2021-10-10 DIAGNOSIS — M79642 Pain in left hand: Secondary | ICD-10-CM | POA: Diagnosis not present

## 2021-10-10 DIAGNOSIS — G5603 Carpal tunnel syndrome, bilateral upper limbs: Secondary | ICD-10-CM | POA: Diagnosis not present

## 2021-10-14 DIAGNOSIS — J301 Allergic rhinitis due to pollen: Secondary | ICD-10-CM | POA: Diagnosis not present

## 2021-10-14 DIAGNOSIS — J3089 Other allergic rhinitis: Secondary | ICD-10-CM | POA: Diagnosis not present

## 2021-10-21 DIAGNOSIS — J301 Allergic rhinitis due to pollen: Secondary | ICD-10-CM | POA: Diagnosis not present

## 2021-10-21 DIAGNOSIS — J3089 Other allergic rhinitis: Secondary | ICD-10-CM | POA: Diagnosis not present

## 2021-10-23 DIAGNOSIS — H43391 Other vitreous opacities, right eye: Secondary | ICD-10-CM | POA: Diagnosis not present

## 2021-10-28 DIAGNOSIS — J3081 Allergic rhinitis due to animal (cat) (dog) hair and dander: Secondary | ICD-10-CM | POA: Diagnosis not present

## 2021-10-28 DIAGNOSIS — J301 Allergic rhinitis due to pollen: Secondary | ICD-10-CM | POA: Diagnosis not present

## 2021-10-28 DIAGNOSIS — J3089 Other allergic rhinitis: Secondary | ICD-10-CM | POA: Diagnosis not present

## 2021-10-29 DIAGNOSIS — H538 Other visual disturbances: Secondary | ICD-10-CM | POA: Diagnosis not present

## 2021-10-29 DIAGNOSIS — H43391 Other vitreous opacities, right eye: Secondary | ICD-10-CM | POA: Diagnosis not present

## 2021-10-31 DIAGNOSIS — H538 Other visual disturbances: Secondary | ICD-10-CM | POA: Diagnosis not present

## 2021-11-04 DIAGNOSIS — J3089 Other allergic rhinitis: Secondary | ICD-10-CM | POA: Diagnosis not present

## 2021-11-04 DIAGNOSIS — J301 Allergic rhinitis due to pollen: Secondary | ICD-10-CM | POA: Diagnosis not present

## 2021-11-04 DIAGNOSIS — J3081 Allergic rhinitis due to animal (cat) (dog) hair and dander: Secondary | ICD-10-CM | POA: Diagnosis not present

## 2021-11-11 DIAGNOSIS — J301 Allergic rhinitis due to pollen: Secondary | ICD-10-CM | POA: Diagnosis not present

## 2021-11-11 DIAGNOSIS — J3081 Allergic rhinitis due to animal (cat) (dog) hair and dander: Secondary | ICD-10-CM | POA: Diagnosis not present

## 2021-11-11 DIAGNOSIS — J3089 Other allergic rhinitis: Secondary | ICD-10-CM | POA: Diagnosis not present

## 2021-11-18 DIAGNOSIS — J3081 Allergic rhinitis due to animal (cat) (dog) hair and dander: Secondary | ICD-10-CM | POA: Diagnosis not present

## 2021-11-18 DIAGNOSIS — J3089 Other allergic rhinitis: Secondary | ICD-10-CM | POA: Diagnosis not present

## 2021-11-18 DIAGNOSIS — J301 Allergic rhinitis due to pollen: Secondary | ICD-10-CM | POA: Diagnosis not present

## 2021-11-25 DIAGNOSIS — J301 Allergic rhinitis due to pollen: Secondary | ICD-10-CM | POA: Diagnosis not present

## 2021-11-25 DIAGNOSIS — J3081 Allergic rhinitis due to animal (cat) (dog) hair and dander: Secondary | ICD-10-CM | POA: Diagnosis not present

## 2021-11-25 DIAGNOSIS — J3089 Other allergic rhinitis: Secondary | ICD-10-CM | POA: Diagnosis not present

## 2021-12-02 DIAGNOSIS — J301 Allergic rhinitis due to pollen: Secondary | ICD-10-CM | POA: Diagnosis not present

## 2021-12-02 DIAGNOSIS — J3089 Other allergic rhinitis: Secondary | ICD-10-CM | POA: Diagnosis not present

## 2021-12-02 DIAGNOSIS — J3081 Allergic rhinitis due to animal (cat) (dog) hair and dander: Secondary | ICD-10-CM | POA: Diagnosis not present

## 2021-12-03 DIAGNOSIS — H43811 Vitreous degeneration, right eye: Secondary | ICD-10-CM | POA: Diagnosis not present

## 2021-12-04 DIAGNOSIS — M25561 Pain in right knee: Secondary | ICD-10-CM | POA: Diagnosis not present

## 2021-12-04 DIAGNOSIS — W19XXXA Unspecified fall, initial encounter: Secondary | ICD-10-CM | POA: Diagnosis not present

## 2021-12-04 DIAGNOSIS — S80211A Abrasion, right knee, initial encounter: Secondary | ICD-10-CM | POA: Diagnosis not present

## 2021-12-04 DIAGNOSIS — S80212A Abrasion, left knee, initial encounter: Secondary | ICD-10-CM | POA: Diagnosis not present

## 2021-12-04 DIAGNOSIS — S82091A Other fracture of right patella, initial encounter for closed fracture: Secondary | ICD-10-CM | POA: Diagnosis not present

## 2021-12-04 DIAGNOSIS — W101XXA Fall (on)(from) sidewalk curb, initial encounter: Secondary | ICD-10-CM | POA: Diagnosis not present

## 2021-12-04 DIAGNOSIS — M79641 Pain in right hand: Secondary | ICD-10-CM | POA: Diagnosis not present

## 2021-12-04 DIAGNOSIS — S82001A Unspecified fracture of right patella, initial encounter for closed fracture: Secondary | ICD-10-CM | POA: Diagnosis not present

## 2021-12-04 DIAGNOSIS — S60221A Contusion of right hand, initial encounter: Secondary | ICD-10-CM | POA: Diagnosis not present

## 2021-12-06 DIAGNOSIS — M25561 Pain in right knee: Secondary | ICD-10-CM | POA: Diagnosis not present

## 2021-12-06 DIAGNOSIS — M25562 Pain in left knee: Secondary | ICD-10-CM | POA: Diagnosis not present

## 2021-12-09 DIAGNOSIS — J301 Allergic rhinitis due to pollen: Secondary | ICD-10-CM | POA: Diagnosis not present

## 2021-12-09 DIAGNOSIS — J3089 Other allergic rhinitis: Secondary | ICD-10-CM | POA: Diagnosis not present

## 2021-12-09 DIAGNOSIS — J3081 Allergic rhinitis due to animal (cat) (dog) hair and dander: Secondary | ICD-10-CM | POA: Diagnosis not present

## 2021-12-09 IMAGING — MG MM DIGITAL SCREENING BILAT W/ TOMO AND CAD
8 series · 8 of 24 positions shown · non-contrast
Comparison: Previous exam(s).

CLINICAL DATA: Screening.

EXAM:
DIGITAL SCREENING BILATERAL MAMMOGRAM WITH TOMOSYNTHESIS AND CAD
TECHNIQUE: Bilateral screening digital craniocaudal and mediolateral oblique
mammograms were obtained. Bilateral screening digital breast
tomosynthesis was performed. The images were evaluated with
computer-aided detection.

[L CC synth-2D]
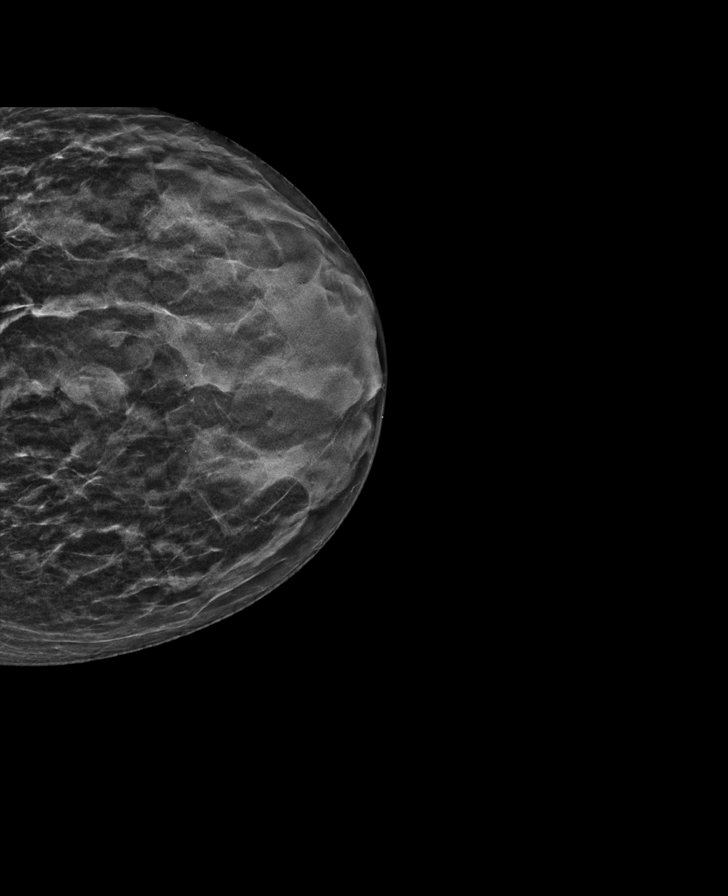

[R CC synth-2D]
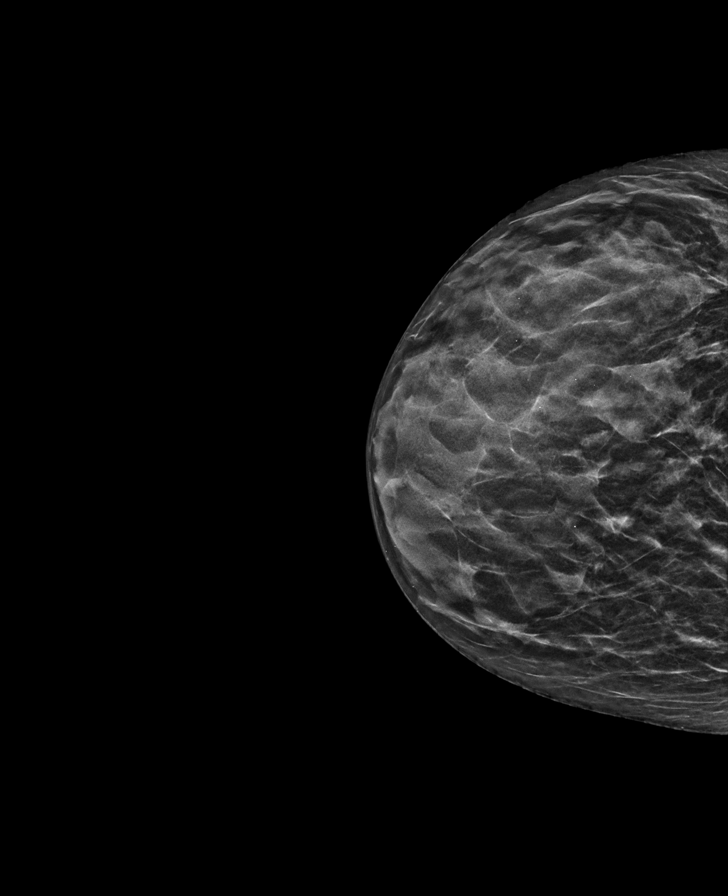

[L MLO synth-2D]
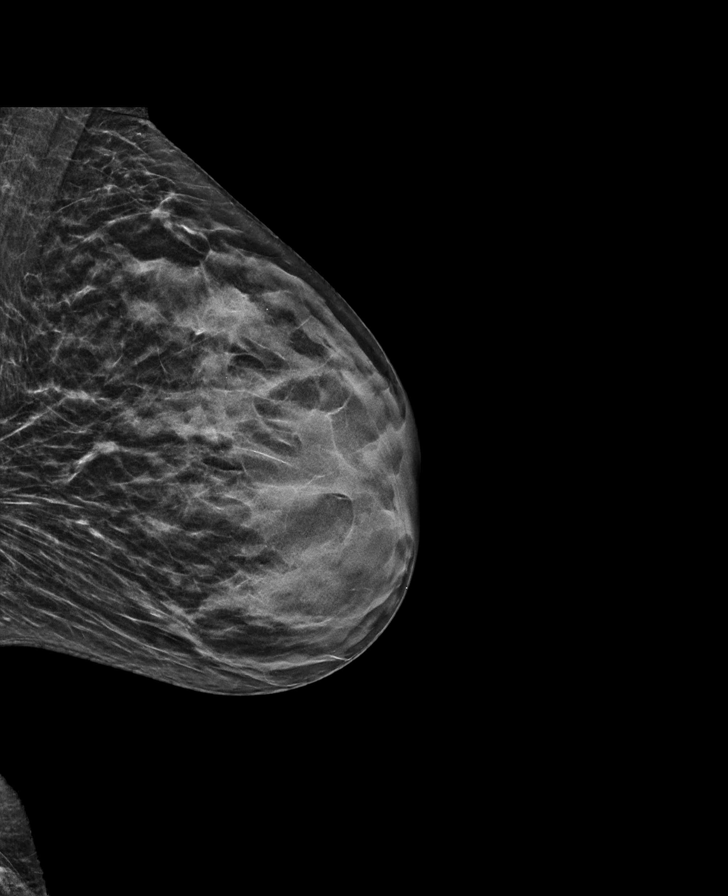

[R MLO synth-2D]
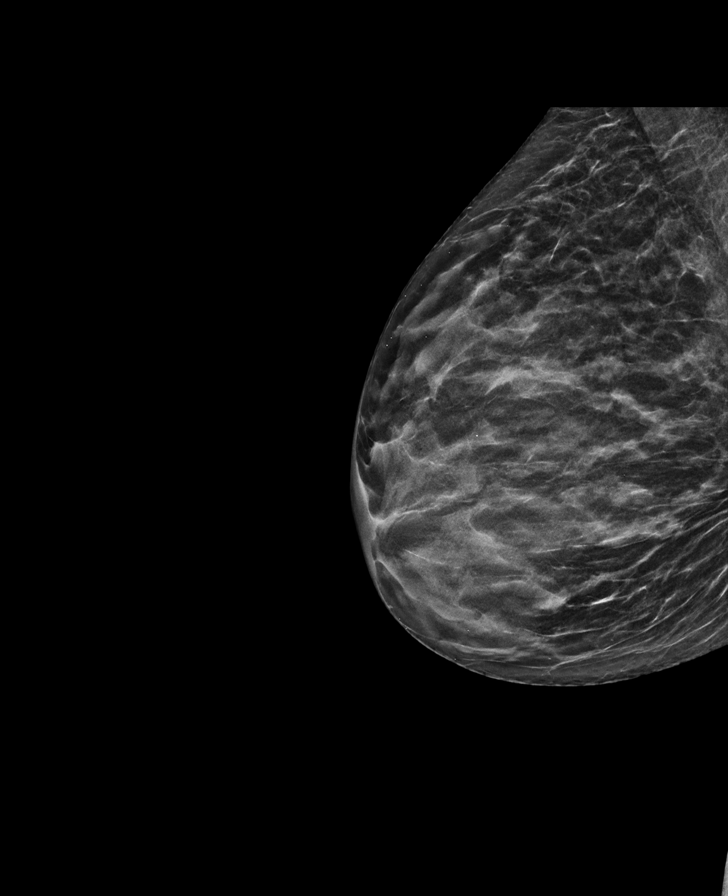

[R MLO tomo · tomo slice 23/45.0]
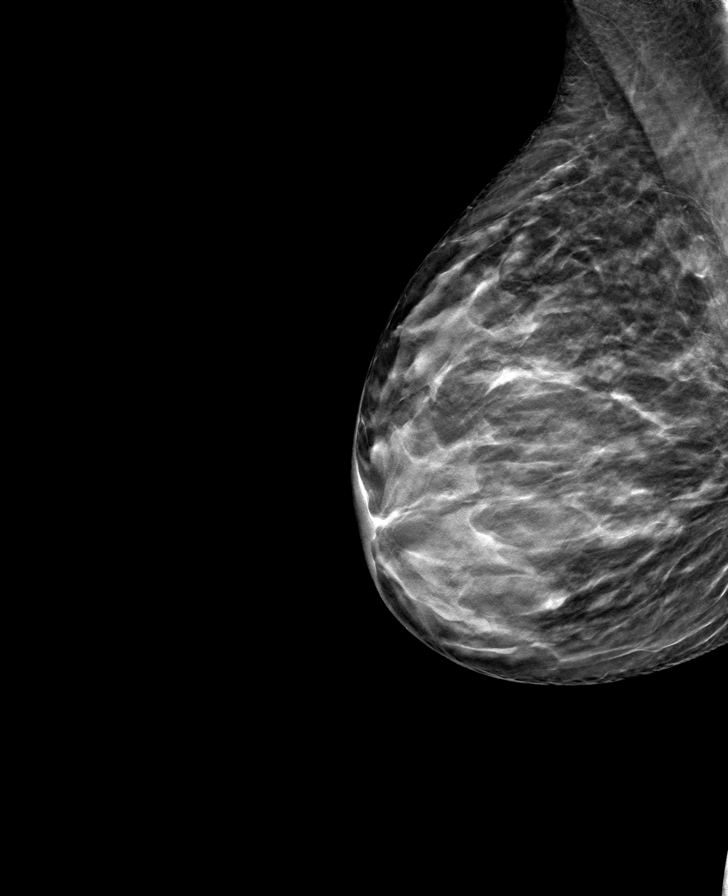

[L CC tomo · tomo slice 22/43.0]
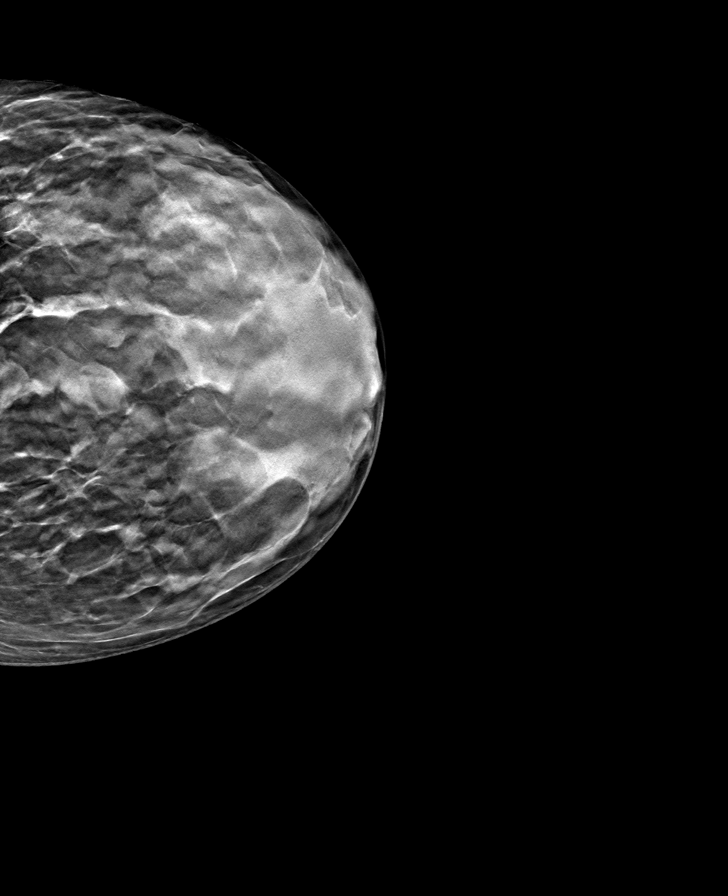

[L MLO tomo · tomo slice 23/45.0]
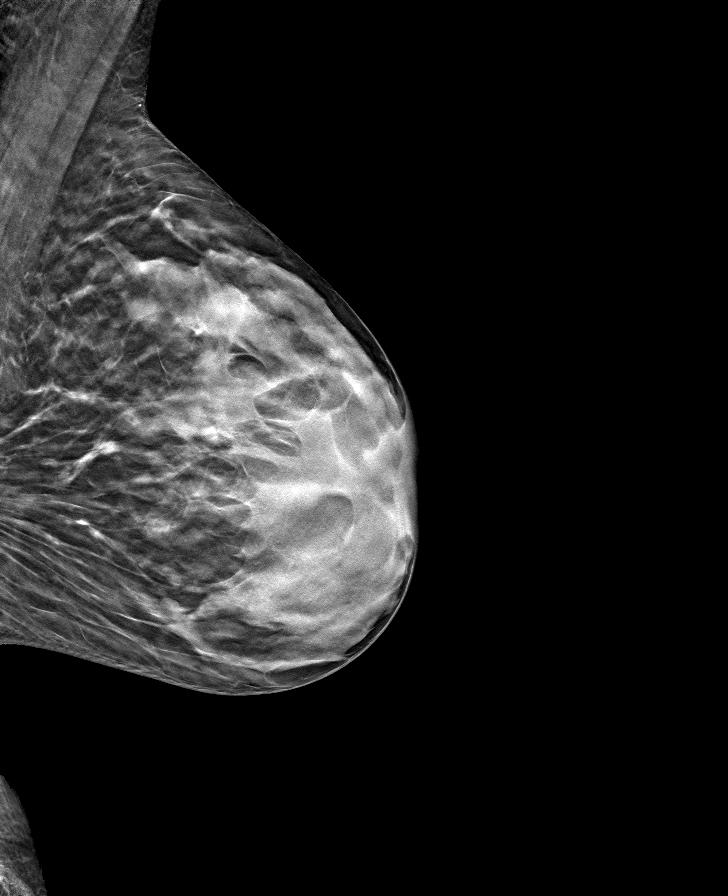

[R CC tomo · tomo slice 21/42.0]
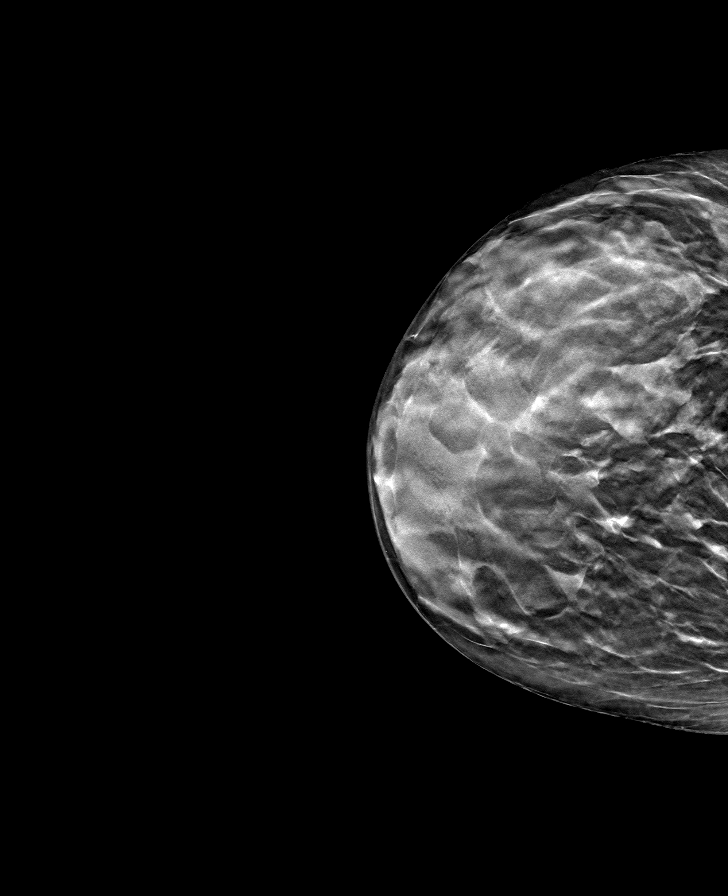

[8 of 24 positions shown; findings below may reference images not displayed]

ACR Breast Density Category d: The breast tissue is extremely dense,
which lowers the sensitivity of mammography
FINDINGS: There are no findings suspicious for malignancy.
IMPRESSION: No mammographic evidence of malignancy. A result letter of this
screening mammogram will be mailed directly to the patient.

RECOMMENDATION:
Screening mammogram in one year. (Code:[id number])

BI-RADS CATEGORY  1: Negative.

## 2021-12-11 DIAGNOSIS — M25561 Pain in right knee: Secondary | ICD-10-CM | POA: Diagnosis not present

## 2021-12-16 DIAGNOSIS — J3081 Allergic rhinitis due to animal (cat) (dog) hair and dander: Secondary | ICD-10-CM | POA: Diagnosis not present

## 2021-12-16 DIAGNOSIS — J3089 Other allergic rhinitis: Secondary | ICD-10-CM | POA: Diagnosis not present

## 2021-12-16 DIAGNOSIS — J301 Allergic rhinitis due to pollen: Secondary | ICD-10-CM | POA: Diagnosis not present

## 2021-12-23 DIAGNOSIS — J3089 Other allergic rhinitis: Secondary | ICD-10-CM | POA: Diagnosis not present

## 2021-12-23 DIAGNOSIS — J301 Allergic rhinitis due to pollen: Secondary | ICD-10-CM | POA: Diagnosis not present

## 2021-12-23 DIAGNOSIS — J3081 Allergic rhinitis due to animal (cat) (dog) hair and dander: Secondary | ICD-10-CM | POA: Diagnosis not present

## 2021-12-27 ENCOUNTER — Other Ambulatory Visit: Payer: Self-pay | Admitting: Gastroenterology

## 2021-12-30 DIAGNOSIS — J3089 Other allergic rhinitis: Secondary | ICD-10-CM | POA: Diagnosis not present

## 2021-12-30 DIAGNOSIS — J301 Allergic rhinitis due to pollen: Secondary | ICD-10-CM | POA: Diagnosis not present

## 2021-12-30 DIAGNOSIS — J3081 Allergic rhinitis due to animal (cat) (dog) hair and dander: Secondary | ICD-10-CM | POA: Diagnosis not present

## 2022-01-06 DIAGNOSIS — J3081 Allergic rhinitis due to animal (cat) (dog) hair and dander: Secondary | ICD-10-CM | POA: Diagnosis not present

## 2022-01-06 DIAGNOSIS — J301 Allergic rhinitis due to pollen: Secondary | ICD-10-CM | POA: Diagnosis not present

## 2022-01-06 DIAGNOSIS — J3089 Other allergic rhinitis: Secondary | ICD-10-CM | POA: Diagnosis not present

## 2022-01-08 DIAGNOSIS — M25561 Pain in right knee: Secondary | ICD-10-CM | POA: Diagnosis not present

## 2022-01-14 DIAGNOSIS — J3089 Other allergic rhinitis: Secondary | ICD-10-CM | POA: Diagnosis not present

## 2022-01-14 DIAGNOSIS — J3081 Allergic rhinitis due to animal (cat) (dog) hair and dander: Secondary | ICD-10-CM | POA: Diagnosis not present

## 2022-01-14 DIAGNOSIS — J301 Allergic rhinitis due to pollen: Secondary | ICD-10-CM | POA: Diagnosis not present

## 2022-03-11 ENCOUNTER — Encounter: Payer: Self-pay | Admitting: Gastroenterology

## 2022-03-11 ENCOUNTER — Ambulatory Visit: Payer: Medicare PPO | Admitting: Gastroenterology

## 2022-03-11 VITALS — BP 132/78 | HR 80 | Ht 63.5 in | Wt 110.0 lb

## 2022-03-11 DIAGNOSIS — K8689 Other specified diseases of pancreas: Secondary | ICD-10-CM

## 2022-03-11 MED ORDER — PANCRELIPASE (LIP-PROT-AMYL) 36000-114000 UNITS PO CPEP: ORAL_CAPSULE | ORAL | 3 refills | Status: DC

## 2022-03-11 NOTE — Patient Instructions (Signed)
We have sent the following medications to your pharmacy for you to pick up at your convenience:  Creon  Follow up in 1 year  _______________________________________________________  If your blood pressure at your visit was 140/90 or greater, please contact your primary care physician to follow up on this.  _______________________________________________________  If you are age 66 or older, your body mass index should be between 23-30. Your Body mass index is 19.18 kg/m. If this is out of the aforementioned range listed, please consider follow up with your Primary Care Provider.  If you are age 46 or younger, your body mass index should be between 19-25. Your Body mass index is 19.18 kg/m. If this is out of the aformentioned range listed, please consider follow up with your Primary Care Provider.   ________________________________________________________  The [place] [facility] providers would like to encourage you to use [facility] to communicate with providers for non-urgent requests or questions.  Due to long hold times on the telephone, sending your provider a message by [facility] may be a faster and more efficient way to get a response.  Please allow 48 business hours for a response.  Please remember that this is for non-urgent requests.  _______________________________________________________   Due to recent changes in healthcare laws, you may see the results of your imaging and laboratory studies on MyChart before your provider has had a chance to review them.  We understand that in some cases there may be results that are confusing or concerning to you. Not all laboratory results come back in the same time frame and the provider may be waiting for multiple results in order to interpret others.  Please give [place] 48 hours in order for your provider to thoroughly review all the results before contacting the office for clarification of your results.    I appreciate the  opportunity to care for  you  Thank You   Harl Bowie , MD

## 2022-03-11 NOTE — Progress Notes (Addendum)
Vicki Jarvis    [id number]    Jun 19, 1955  Primary Care Physician:Stoneking, Christiane Ha, MD  Referring Physician: Lajean Manes, MD [address] [place] [address],  [place] [address]   Chief complaint: pancreatic insufficiency HPI:  67 year old female here for follow up of pancreatic insufficiency and Creon refill.  Patient was last seen by me on 01/28/2019 for follow-up visit with persistent globus sensation and dysphagia, worse with liquids usually post dinner. She reported going into coughing fits. She had sensation to keep swallowing multiple times. Better after she was treated with Diflucan and clotrimazole lozenges.  She felt her symptoms to recur when she was off Diflucan and clotrimazole.  She was severely restricting her diet and avoiding all sugars at that time. Her constipation had resolved and she was having a bowel movement every other day or daily.  Today, she states that she has been doing okay overall. However, x3 months ago she tripped and fell on the sidewalk resulting in a right patellar fracture. She is recovering well without needing surgery. She did not need pain medications apart from occasional Ibuprofen and Tylenol initially.  She is taking Creon-2 capsules with meals and 1 capsule with snacks. She has some abdominal pain when she gets to the end of her Creon supply which she attributes to the capsules not being as fresh. She is eating 4 smaller meals per day which is working well for her.  She is having small bowel movements every other day to daily. She takes stool softeners occasionally. She denies any blood in her stools or black stools.  She was seen by ENT and started on Pepcid for ~1 year. This along with removing chocolate and sodas relieved her dysphagia and globus sensation completely.  She had a murmur on today's exam which she states is new for her. She has never been evaluated by cardiology. She denies any issues with chest pain or shortness  of breath. She reports having an ECG and labs in June 2023 with her PCP which were all normal. She plans to see her PCP for her annual physical in June 2024 and would like to discuss her murmur at that time. She declined offer for cardiology referral at this time.    Relevant GI Hx:  EGD October 06, 2018: Esophagus appeared normal with no abnormality to explain dysphagia.  Empirically dilated with Maloney bougie dilator to 80 [place], showed mild mucosal disruption.  Mild gastritis, biopsies negative for H. Pylori  Fees study January 07, 2019 showed erythematous arytenoid cartilages, normal vocal cords mild standing secretions in pyriform sinuses and interarytenoid space which could possibly be filling with secretions and possible aspiration and airway causing reflexive coughing episodes during the night Candida diffusely on lingual base   CT abdomen pelvis with contrast May 2020: Negative for any pancreatic cyst or lesions.   Colonoscopy January 2020: Cecal adenomatous polyp removed, random right and left colon biopsies negative for microscopic colitis GI pathogen panel negative Fecal elastase low suggestive of moderate pancreatic insufficiency   Current Outpatient Medications:    Ca Phosphate-Cholecalciferol (CALCIUM 500 + D3) 250-12.5 MG-MCG CHEW, Chew 1 tablet by mouth daily., Disp: , Rfl:    cetirizine (ZYRTEC) 10 MG tablet, Take 10 mg by mouth., Disp: , Rfl:    CREON 36000-114000 units CPEP capsule, TAKE 2 CAPSULES DURING EACH MEAL AND 1 CAPSULE DURING [facility]. (Patient taking differently: Take 36,000 Units by mouth 3 (three) times daily  before meals. 2 with meals ad 1 with snacks), Disp: 200 capsule, Rfl: 3   dicyclomine (BENTYL) 20 MG tablet, Take 20 mg by mouth every 6 (six) hours as needed for spasms., Disp: , Rfl:    EPINEPHrine 0.3 mg/0.3 mL IJ SOAJ injection, Inject into the muscle as directed., Disp: , Rfl:    famotidine (PEPCID) 20 MG tablet, Take 10 mg by mouth daily. ,  Disp: , Rfl:    MAGNESIUM GLUCONATE PO, Take 100 mg by mouth., Disp: , Rfl:    Multiple Vitamin (MULTIVITAMIN) tablet, Take 1 tablet by mouth daily., Disp: , Rfl:    Omega-3 Fatty Acids (FISH OIL) 1200 MG CAPS, Take by mouth., Disp: , Rfl:    UNABLE TO FIND, Pt gets allergy shots once a week, Disp: , Rfl:    Vitamin A 2400 MCG (8000 UT) TABS, Take 1 tablet by mouth daily., Disp: , Rfl:    vitamin B-12 (CYANOCOBALAMIN) 100 MCG tablet, Take 1 tablet by mouth daily., Disp: , Rfl:    Vitamin E 90 MG (200 UNIT) CAPS, Take 1 capsule by mouth daily., Disp: , Rfl:     Allergies as of 03/11/2022 - Review Complete 03/11/2022  Allergen Reaction Noted   Doxycycline  05/26/2008   Penicillins  05/26/2008   Welchol [colesevelam hcl] Rash 06/03/2018    Past Medical History:  Diagnosis Date   Allergy    Anxiety    Arthritis    Depression    GERD (gastroesophageal reflux disease)    Lymphocytic colitis    Panic disorder    Patella fracture     Past Surgical History:  Procedure Laterality Date   COLONOSCOPY     ESOPHAGOGASTRODUODENOSCOPY ENDOSCOPY     thumb surgery Right     Family History  Problem Relation Age of Onset   Breast cancer Mother    Ulcerative colitis Mother    Heart disease Mother    Esophagitis Mother    Colon cancer Maternal Grandmother    Heart disease Other        grandfather   Heart disease Other        uncle   Irritable bowel syndrome Sister    Colon polyps Neg Hx    Diabetes Neg Hx    Esophageal cancer Neg Hx    Kidney disease Neg Hx    Gallbladder disease Neg Hx    Stomach cancer Neg Hx    Rectal cancer Neg Hx     Social History   Socioeconomic History   Marital status: Married    Spouse name: Not on file   Number of children: 0   Years of education: Not on file   Highest education level: Not on file  Occupational History   Occupation: Secretary/retired  Tobacco Use   Smoking status: Former    Types: Cigarettes    Quit date: 01/15/1981     Years since quitting: 41.1   Smokeless tobacco: Never  Vaping Use   Vaping Use: Never used  Substance and Sexual Activity   Alcohol use: Yes    Alcohol/week: 0.0 standard drinks of alcohol    Comment: Occassionally   Drug use: No   Sexual activity: Not on file  Other Topics Concern   Not on file  Social History Narrative   Not on file   Social Determinants of Health   Financial Resource Strain: Not on file  Food Insecurity: Not on file  Transportation Needs: Not on file  Physical Activity: Not on  file  Stress: Not on file  Social Connections: Not on file  Intimate Partner Violence: Not on file      Review of systems: Review of Systems  Constitutional:  Negative for appetite change and fever.  HENT:  Negative for trouble swallowing.   Respiratory:  Negative for cough and shortness of breath.   Cardiovascular:  Negative for chest pain.  Gastrointestinal:  Negative for abdominal distention, abdominal pain, anal bleeding, blood in stool, constipation, diarrhea, nausea, rectal pain and vomiting.  Genitourinary:  Negative for dysuria.  Musculoskeletal:  Positive for arthralgias (right patellar fracture). Negative for back pain.  Skin:  Negative for rash.  Neurological:  Negative for weakness.  All other systems reviewed and are negative.     Physical Exam: Vitals:   03/11/22 1352  BP: 132/78  Pulse: 80  SpO2: 98%    Body mass index is 19.18 kg/m. General: well-appearing   Eyes: sclera anicteric, no redness ENT: oral mucosa moist without lesions, no cervical or supraclavicular lymphadenopathy CV: RRR, no JVD, no peripheral edema; + systolic murmur Resp: clear to auscultation bilaterally, normal RR and effort noted GI: soft, no tenderness, with active bowel sounds. No guarding or palpable organomegaly noted. Skin; warm and dry, no rash or jaundice noted Neuro: awake, alert and oriented x 3. Normal gross motor function and fluent speech   Data  Reviewed:  Reviewed labs, radiology imaging, old records and pertinent past GI work up     Assessment and Plan/Recommendations:  67 year old female for pancreatic insufficiency follow up.  Continue Creon 1 to 2 capsules with meals, continue with small frequent meals with low-fat diet  Advised patient to eat high-calorie high-protein diet  Continue antireflux measures for GERD  Mild systolic murmur on exam, advised patient to follow-up with PCP.  She denies any cardiac symptoms, deferred referral to cardiology  Return in 1 year or sooner if needed    CC: Stoneking, Hal, MD    I,Alexis Herring,acting as a scribe for Harl Bowie, MD.,have documented all relevant documentation on the behalf of Harl Bowie, MD,as directed by  Harl Bowie, MD while in the presence of Harl Bowie, MD.   This visit required 30 minutes of patient care (this includes precharting, chart review, review of results, face-to-face time used for counseling as well as treatment plan and follow-up. The patient was provided an opportunity to ask questions and all were answered. The patient agreed with the plan and demonstrated an understanding of the instructions.  Damaris Hippo , MD

## 2022-03-13 NOTE — Progress Notes (Deleted)
   Established Patient Office Visit  Subjective   Patient ID: Vicki Jarvis, female    DOB: Jan 04, 1956  Age: 67 y.o. MRN: [id number]  Chief Complaint  Patient presents with   EPI    Doing well with Creon. No complaints     HPI    ROS    Objective:     BP 132/78   Pulse 80   Ht 5' 3.5" (1.613 m)   Wt 110 lb (49.9 kg)   SpO2 98%   BMI 19.18 kg/m    Physical Exam   No results found for any visits on 03/11/22.    The ASCVD Risk score (Arnett DK, et al., 2019) failed to calculate for the following reasons:   Cannot find a previous HDL lab   Cannot find a previous total cholesterol lab    Assessment & Plan:   Problem List Items Addressed This Visit   None Visit Diagnoses     Pancreatic insufficiency    -  Primary       No follow-ups on file.    Barb Merino, RN

## 2022-04-02 ENCOUNTER — Other Ambulatory Visit: Payer: Self-pay | Admitting: Gastroenterology

## 2022-07-18 ENCOUNTER — Other Ambulatory Visit: Payer: Self-pay | Admitting: Internal Medicine

## 2022-07-18 ENCOUNTER — Other Ambulatory Visit: Payer: Self-pay

## 2022-07-18 DIAGNOSIS — Z1231 Encounter for screening mammogram for malignant neoplasm of breast: Secondary | ICD-10-CM

## 2022-07-22 ENCOUNTER — Other Ambulatory Visit: Payer: Self-pay | Admitting: Internal Medicine

## 2022-07-22 ENCOUNTER — Other Ambulatory Visit: Payer: Self-pay | Admitting: Gastroenterology

## 2022-07-22 DIAGNOSIS — M810 Age-related osteoporosis without current pathological fracture: Secondary | ICD-10-CM

## 2022-08-19 ENCOUNTER — Ambulatory Visit
Admission: RE | Admit: 2022-08-19 | Discharge: 2022-08-19 | Disposition: A | Discharge status: Home / Self Care | Payer: Medicare PPO | Source: Ambulatory Visit | Attending: Internal Medicine | Admitting: Internal Medicine

## 2022-08-19 DIAGNOSIS — Z1231 Encounter for screening mammogram for malignant neoplasm of breast: Secondary | ICD-10-CM

## 2022-08-28 ENCOUNTER — Inpatient Hospital Stay: Admission: RE | Admit: 2022-08-28 | Payer: Medicare PPO | Source: Ambulatory Visit

## 2022-09-23 DIAGNOSIS — J301 Allergic rhinitis due to pollen: Secondary | ICD-10-CM | POA: Diagnosis not present

## 2022-09-23 DIAGNOSIS — J3089 Other allergic rhinitis: Secondary | ICD-10-CM | POA: Diagnosis not present

## 2022-09-23 DIAGNOSIS — J3081 Allergic rhinitis due to animal (cat) (dog) hair and dander: Secondary | ICD-10-CM | POA: Diagnosis not present

## 2022-09-29 DIAGNOSIS — J301 Allergic rhinitis due to pollen: Secondary | ICD-10-CM | POA: Diagnosis not present

## 2022-09-29 DIAGNOSIS — J3089 Other allergic rhinitis: Secondary | ICD-10-CM | POA: Diagnosis not present

## 2022-09-29 DIAGNOSIS — J3081 Allergic rhinitis due to animal (cat) (dog) hair and dander: Secondary | ICD-10-CM | POA: Diagnosis not present

## 2022-10-06 DIAGNOSIS — J301 Allergic rhinitis due to pollen: Secondary | ICD-10-CM | POA: Diagnosis not present

## 2022-10-06 DIAGNOSIS — J3081 Allergic rhinitis due to animal (cat) (dog) hair and dander: Secondary | ICD-10-CM | POA: Diagnosis not present

## 2022-10-06 DIAGNOSIS — J3089 Other allergic rhinitis: Secondary | ICD-10-CM | POA: Diagnosis not present

## 2022-10-13 ENCOUNTER — Ambulatory Visit
Admission: RE | Admit: 2022-10-13 | Discharge: 2022-10-13 | Disposition: A | Discharge status: Home / Self Care | Payer: Medicare PPO | Source: Ambulatory Visit | Attending: Internal Medicine | Admitting: Internal Medicine

## 2022-10-13 DIAGNOSIS — J3089 Other allergic rhinitis: Secondary | ICD-10-CM | POA: Diagnosis not present

## 2022-10-13 DIAGNOSIS — M8588 Other specified disorders of bone density and structure, other site: Secondary | ICD-10-CM | POA: Diagnosis not present

## 2022-10-13 DIAGNOSIS — J301 Allergic rhinitis due to pollen: Secondary | ICD-10-CM | POA: Diagnosis not present

## 2022-10-13 DIAGNOSIS — N958 Other specified menopausal and perimenopausal disorders: Secondary | ICD-10-CM | POA: Diagnosis not present

## 2022-10-13 DIAGNOSIS — J3081 Allergic rhinitis due to animal (cat) (dog) hair and dander: Secondary | ICD-10-CM | POA: Diagnosis not present

## 2022-10-13 DIAGNOSIS — E349 Endocrine disorder, unspecified: Secondary | ICD-10-CM | POA: Diagnosis not present

## 2022-10-13 DIAGNOSIS — M810 Age-related osteoporosis without current pathological fracture: Secondary | ICD-10-CM

## 2022-10-20 DIAGNOSIS — J3081 Allergic rhinitis due to animal (cat) (dog) hair and dander: Secondary | ICD-10-CM | POA: Diagnosis not present

## 2022-10-20 DIAGNOSIS — J301 Allergic rhinitis due to pollen: Secondary | ICD-10-CM | POA: Diagnosis not present

## 2022-10-20 DIAGNOSIS — J3089 Other allergic rhinitis: Secondary | ICD-10-CM | POA: Diagnosis not present

## 2022-10-27 DIAGNOSIS — J3089 Other allergic rhinitis: Secondary | ICD-10-CM | POA: Diagnosis not present

## 2022-10-27 DIAGNOSIS — J301 Allergic rhinitis due to pollen: Secondary | ICD-10-CM | POA: Diagnosis not present

## 2022-10-27 DIAGNOSIS — J3081 Allergic rhinitis due to animal (cat) (dog) hair and dander: Secondary | ICD-10-CM | POA: Diagnosis not present

## 2022-10-30 DIAGNOSIS — M5412 Radiculopathy, cervical region: Secondary | ICD-10-CM | POA: Diagnosis not present

## 2022-10-30 DIAGNOSIS — M47812 Spondylosis without myelopathy or radiculopathy, cervical region: Secondary | ICD-10-CM | POA: Diagnosis not present

## 2022-11-03 DIAGNOSIS — J3089 Other allergic rhinitis: Secondary | ICD-10-CM | POA: Diagnosis not present

## 2022-11-03 DIAGNOSIS — J3081 Allergic rhinitis due to animal (cat) (dog) hair and dander: Secondary | ICD-10-CM | POA: Diagnosis not present

## 2022-11-03 DIAGNOSIS — J301 Allergic rhinitis due to pollen: Secondary | ICD-10-CM | POA: Diagnosis not present

## 2022-11-07 DIAGNOSIS — M5412 Radiculopathy, cervical region: Secondary | ICD-10-CM | POA: Diagnosis not present

## 2022-11-07 DIAGNOSIS — M47812 Spondylosis without myelopathy or radiculopathy, cervical region: Secondary | ICD-10-CM | POA: Diagnosis not present

## 2022-11-10 DIAGNOSIS — M47812 Spondylosis without myelopathy or radiculopathy, cervical region: Secondary | ICD-10-CM | POA: Diagnosis not present

## 2022-11-10 DIAGNOSIS — J3081 Allergic rhinitis due to animal (cat) (dog) hair and dander: Secondary | ICD-10-CM | POA: Diagnosis not present

## 2022-11-10 DIAGNOSIS — R6889 Other general symptoms and signs: Secondary | ICD-10-CM | POA: Diagnosis not present

## 2022-11-10 DIAGNOSIS — J3089 Other allergic rhinitis: Secondary | ICD-10-CM | POA: Diagnosis not present

## 2022-11-10 DIAGNOSIS — J301 Allergic rhinitis due to pollen: Secondary | ICD-10-CM | POA: Diagnosis not present

## 2022-11-13 DIAGNOSIS — M810 Age-related osteoporosis without current pathological fracture: Secondary | ICD-10-CM | POA: Diagnosis not present

## 2022-11-13 DIAGNOSIS — Z8639 Personal history of other endocrine, nutritional and metabolic disease: Secondary | ICD-10-CM | POA: Diagnosis not present

## 2022-11-13 DIAGNOSIS — K219 Gastro-esophageal reflux disease without esophagitis: Secondary | ICD-10-CM | POA: Diagnosis not present

## 2022-11-13 DIAGNOSIS — K8681 Exocrine pancreatic insufficiency: Secondary | ICD-10-CM | POA: Diagnosis not present

## 2022-11-17 DIAGNOSIS — M47812 Spondylosis without myelopathy or radiculopathy, cervical region: Secondary | ICD-10-CM | POA: Diagnosis not present

## 2022-11-17 DIAGNOSIS — J3081 Allergic rhinitis due to animal (cat) (dog) hair and dander: Secondary | ICD-10-CM | POA: Diagnosis not present

## 2022-11-17 DIAGNOSIS — M810 Age-related osteoporosis without current pathological fracture: Secondary | ICD-10-CM | POA: Diagnosis not present

## 2022-11-17 DIAGNOSIS — J301 Allergic rhinitis due to pollen: Secondary | ICD-10-CM | POA: Diagnosis not present

## 2022-11-17 DIAGNOSIS — J3089 Other allergic rhinitis: Secondary | ICD-10-CM | POA: Diagnosis not present

## 2022-11-17 DIAGNOSIS — M5412 Radiculopathy, cervical region: Secondary | ICD-10-CM | POA: Diagnosis not present

## 2022-11-24 DIAGNOSIS — J3081 Allergic rhinitis due to animal (cat) (dog) hair and dander: Secondary | ICD-10-CM | POA: Diagnosis not present

## 2022-11-24 DIAGNOSIS — J3089 Other allergic rhinitis: Secondary | ICD-10-CM | POA: Diagnosis not present

## 2022-11-24 DIAGNOSIS — J301 Allergic rhinitis due to pollen: Secondary | ICD-10-CM | POA: Diagnosis not present

## 2022-12-01 DIAGNOSIS — J3081 Allergic rhinitis due to animal (cat) (dog) hair and dander: Secondary | ICD-10-CM | POA: Diagnosis not present

## 2022-12-01 DIAGNOSIS — J3089 Other allergic rhinitis: Secondary | ICD-10-CM | POA: Diagnosis not present

## 2022-12-01 DIAGNOSIS — J301 Allergic rhinitis due to pollen: Secondary | ICD-10-CM | POA: Diagnosis not present

## 2022-12-05 ENCOUNTER — Other Ambulatory Visit: Payer: Self-pay | Admitting: Gastroenterology

## 2022-12-08 DIAGNOSIS — M5412 Radiculopathy, cervical region: Secondary | ICD-10-CM | POA: Diagnosis not present

## 2022-12-08 DIAGNOSIS — M47812 Spondylosis without myelopathy or radiculopathy, cervical region: Secondary | ICD-10-CM | POA: Diagnosis not present

## 2022-12-08 DIAGNOSIS — J3089 Other allergic rhinitis: Secondary | ICD-10-CM | POA: Diagnosis not present

## 2022-12-08 DIAGNOSIS — J3081 Allergic rhinitis due to animal (cat) (dog) hair and dander: Secondary | ICD-10-CM | POA: Diagnosis not present

## 2022-12-08 DIAGNOSIS — J301 Allergic rhinitis due to pollen: Secondary | ICD-10-CM | POA: Diagnosis not present

## 2022-12-15 DIAGNOSIS — J3089 Other allergic rhinitis: Secondary | ICD-10-CM | POA: Diagnosis not present

## 2022-12-15 DIAGNOSIS — J3081 Allergic rhinitis due to animal (cat) (dog) hair and dander: Secondary | ICD-10-CM | POA: Diagnosis not present

## 2022-12-15 DIAGNOSIS — J301 Allergic rhinitis due to pollen: Secondary | ICD-10-CM | POA: Diagnosis not present

## 2022-12-18 DIAGNOSIS — M5412 Radiculopathy, cervical region: Secondary | ICD-10-CM | POA: Diagnosis not present

## 2022-12-18 DIAGNOSIS — M47812 Spondylosis without myelopathy or radiculopathy, cervical region: Secondary | ICD-10-CM | POA: Diagnosis not present

## 2022-12-22 DIAGNOSIS — J3081 Allergic rhinitis due to animal (cat) (dog) hair and dander: Secondary | ICD-10-CM | POA: Diagnosis not present

## 2022-12-22 DIAGNOSIS — J301 Allergic rhinitis due to pollen: Secondary | ICD-10-CM | POA: Diagnosis not present

## 2022-12-22 DIAGNOSIS — J3089 Other allergic rhinitis: Secondary | ICD-10-CM | POA: Diagnosis not present

## 2022-12-29 DIAGNOSIS — J3089 Other allergic rhinitis: Secondary | ICD-10-CM | POA: Diagnosis not present

## 2022-12-29 DIAGNOSIS — J301 Allergic rhinitis due to pollen: Secondary | ICD-10-CM | POA: Diagnosis not present

## 2022-12-29 DIAGNOSIS — J3081 Allergic rhinitis due to animal (cat) (dog) hair and dander: Secondary | ICD-10-CM | POA: Diagnosis not present

## 2023-01-05 DIAGNOSIS — J3089 Other allergic rhinitis: Secondary | ICD-10-CM | POA: Diagnosis not present

## 2023-01-05 DIAGNOSIS — J301 Allergic rhinitis due to pollen: Secondary | ICD-10-CM | POA: Diagnosis not present

## 2023-01-05 DIAGNOSIS — J3081 Allergic rhinitis due to animal (cat) (dog) hair and dander: Secondary | ICD-10-CM | POA: Diagnosis not present

## 2023-01-07 DIAGNOSIS — M47812 Spondylosis without myelopathy or radiculopathy, cervical region: Secondary | ICD-10-CM | POA: Diagnosis not present

## 2023-01-07 DIAGNOSIS — M5412 Radiculopathy, cervical region: Secondary | ICD-10-CM | POA: Diagnosis not present

## 2023-01-12 DIAGNOSIS — J301 Allergic rhinitis due to pollen: Secondary | ICD-10-CM | POA: Diagnosis not present

## 2023-01-12 DIAGNOSIS — J3089 Other allergic rhinitis: Secondary | ICD-10-CM | POA: Diagnosis not present

## 2023-01-12 DIAGNOSIS — J3081 Allergic rhinitis due to animal (cat) (dog) hair and dander: Secondary | ICD-10-CM | POA: Diagnosis not present

## 2023-01-17 DIAGNOSIS — M5412 Radiculopathy, cervical region: Secondary | ICD-10-CM | POA: Diagnosis not present

## 2023-01-17 DIAGNOSIS — M47812 Spondylosis without myelopathy or radiculopathy, cervical region: Secondary | ICD-10-CM | POA: Diagnosis not present

## 2023-01-19 DIAGNOSIS — J3081 Allergic rhinitis due to animal (cat) (dog) hair and dander: Secondary | ICD-10-CM | POA: Diagnosis not present

## 2023-01-19 DIAGNOSIS — J301 Allergic rhinitis due to pollen: Secondary | ICD-10-CM | POA: Diagnosis not present

## 2023-01-19 DIAGNOSIS — J3089 Other allergic rhinitis: Secondary | ICD-10-CM | POA: Diagnosis not present

## 2023-01-20 DIAGNOSIS — R030 Elevated blood-pressure reading, without diagnosis of hypertension: Secondary | ICD-10-CM | POA: Diagnosis not present

## 2023-01-20 DIAGNOSIS — M810 Age-related osteoporosis without current pathological fracture: Secondary | ICD-10-CM | POA: Diagnosis not present

## 2023-01-26 DIAGNOSIS — J3081 Allergic rhinitis due to animal (cat) (dog) hair and dander: Secondary | ICD-10-CM | POA: Diagnosis not present

## 2023-01-26 DIAGNOSIS — J3089 Other allergic rhinitis: Secondary | ICD-10-CM | POA: Diagnosis not present

## 2023-01-26 DIAGNOSIS — J301 Allergic rhinitis due to pollen: Secondary | ICD-10-CM | POA: Diagnosis not present

## 2023-02-04 DIAGNOSIS — R059 Cough, unspecified: Secondary | ICD-10-CM | POA: Diagnosis not present

## 2023-02-04 DIAGNOSIS — U071 COVID-19: Secondary | ICD-10-CM | POA: Diagnosis not present

## 2023-02-07 DIAGNOSIS — M5412 Radiculopathy, cervical region: Secondary | ICD-10-CM | POA: Diagnosis not present

## 2023-02-07 DIAGNOSIS — M47812 Spondylosis without myelopathy or radiculopathy, cervical region: Secondary | ICD-10-CM | POA: Diagnosis not present

## 2023-02-11 DIAGNOSIS — J3089 Other allergic rhinitis: Secondary | ICD-10-CM | POA: Diagnosis not present

## 2023-02-11 DIAGNOSIS — J301 Allergic rhinitis due to pollen: Secondary | ICD-10-CM | POA: Diagnosis not present

## 2023-02-13 DIAGNOSIS — J301 Allergic rhinitis due to pollen: Secondary | ICD-10-CM | POA: Diagnosis not present

## 2023-02-13 DIAGNOSIS — J3089 Other allergic rhinitis: Secondary | ICD-10-CM | POA: Diagnosis not present

## 2023-02-16 DIAGNOSIS — J3081 Allergic rhinitis due to animal (cat) (dog) hair and dander: Secondary | ICD-10-CM | POA: Diagnosis not present

## 2023-02-16 DIAGNOSIS — J3089 Other allergic rhinitis: Secondary | ICD-10-CM | POA: Diagnosis not present

## 2023-02-16 DIAGNOSIS — J301 Allergic rhinitis due to pollen: Secondary | ICD-10-CM | POA: Diagnosis not present

## 2023-02-17 DIAGNOSIS — M5412 Radiculopathy, cervical region: Secondary | ICD-10-CM | POA: Diagnosis not present

## 2023-02-17 DIAGNOSIS — M47812 Spondylosis without myelopathy or radiculopathy, cervical region: Secondary | ICD-10-CM | POA: Diagnosis not present

## 2023-02-23 DIAGNOSIS — J3081 Allergic rhinitis due to animal (cat) (dog) hair and dander: Secondary | ICD-10-CM | POA: Diagnosis not present

## 2023-02-23 DIAGNOSIS — J3089 Other allergic rhinitis: Secondary | ICD-10-CM | POA: Diagnosis not present

## 2023-02-23 DIAGNOSIS — J301 Allergic rhinitis due to pollen: Secondary | ICD-10-CM | POA: Diagnosis not present

## 2023-03-03 DIAGNOSIS — J301 Allergic rhinitis due to pollen: Secondary | ICD-10-CM | POA: Diagnosis not present

## 2023-03-03 DIAGNOSIS — J3081 Allergic rhinitis due to animal (cat) (dog) hair and dander: Secondary | ICD-10-CM | POA: Diagnosis not present

## 2023-03-03 DIAGNOSIS — J3089 Other allergic rhinitis: Secondary | ICD-10-CM | POA: Diagnosis not present

## 2023-03-09 DIAGNOSIS — J301 Allergic rhinitis due to pollen: Secondary | ICD-10-CM | POA: Diagnosis not present

## 2023-03-09 DIAGNOSIS — J3081 Allergic rhinitis due to animal (cat) (dog) hair and dander: Secondary | ICD-10-CM | POA: Diagnosis not present

## 2023-03-09 DIAGNOSIS — J3089 Other allergic rhinitis: Secondary | ICD-10-CM | POA: Diagnosis not present

## 2023-03-10 DIAGNOSIS — M5412 Radiculopathy, cervical region: Secondary | ICD-10-CM | POA: Diagnosis not present

## 2023-03-10 DIAGNOSIS — M47812 Spondylosis without myelopathy or radiculopathy, cervical region: Secondary | ICD-10-CM | POA: Diagnosis not present

## 2023-03-16 DIAGNOSIS — J3089 Other allergic rhinitis: Secondary | ICD-10-CM | POA: Diagnosis not present

## 2023-03-16 DIAGNOSIS — J3081 Allergic rhinitis due to animal (cat) (dog) hair and dander: Secondary | ICD-10-CM | POA: Diagnosis not present

## 2023-03-16 DIAGNOSIS — J301 Allergic rhinitis due to pollen: Secondary | ICD-10-CM | POA: Diagnosis not present

## 2023-03-20 DIAGNOSIS — M5412 Radiculopathy, cervical region: Secondary | ICD-10-CM | POA: Diagnosis not present

## 2023-03-20 DIAGNOSIS — M47812 Spondylosis without myelopathy or radiculopathy, cervical region: Secondary | ICD-10-CM | POA: Diagnosis not present

## 2023-03-23 DIAGNOSIS — J301 Allergic rhinitis due to pollen: Secondary | ICD-10-CM | POA: Diagnosis not present

## 2023-03-23 DIAGNOSIS — J3089 Other allergic rhinitis: Secondary | ICD-10-CM | POA: Diagnosis not present

## 2023-03-23 DIAGNOSIS — J3081 Allergic rhinitis due to animal (cat) (dog) hair and dander: Secondary | ICD-10-CM | POA: Diagnosis not present

## 2023-03-30 DIAGNOSIS — J3081 Allergic rhinitis due to animal (cat) (dog) hair and dander: Secondary | ICD-10-CM | POA: Diagnosis not present

## 2023-03-30 DIAGNOSIS — J301 Allergic rhinitis due to pollen: Secondary | ICD-10-CM | POA: Diagnosis not present

## 2023-03-30 DIAGNOSIS — J3089 Other allergic rhinitis: Secondary | ICD-10-CM | POA: Diagnosis not present

## 2023-04-06 DIAGNOSIS — J3081 Allergic rhinitis due to animal (cat) (dog) hair and dander: Secondary | ICD-10-CM | POA: Diagnosis not present

## 2023-04-06 DIAGNOSIS — J3089 Other allergic rhinitis: Secondary | ICD-10-CM | POA: Diagnosis not present

## 2023-04-06 DIAGNOSIS — J301 Allergic rhinitis due to pollen: Secondary | ICD-10-CM | POA: Diagnosis not present

## 2023-04-09 DIAGNOSIS — M5412 Radiculopathy, cervical region: Secondary | ICD-10-CM | POA: Diagnosis not present

## 2023-04-09 DIAGNOSIS — M47812 Spondylosis without myelopathy or radiculopathy, cervical region: Secondary | ICD-10-CM | POA: Diagnosis not present

## 2023-04-10 ENCOUNTER — Other Ambulatory Visit: Payer: Self-pay | Admitting: Gastroenterology

## 2023-04-10 NOTE — Telephone Encounter (Signed)
 Needs office visit.

## 2023-04-13 DIAGNOSIS — J3081 Allergic rhinitis due to animal (cat) (dog) hair and dander: Secondary | ICD-10-CM | POA: Diagnosis not present

## 2023-04-13 DIAGNOSIS — J3089 Other allergic rhinitis: Secondary | ICD-10-CM | POA: Diagnosis not present

## 2023-04-13 DIAGNOSIS — J301 Allergic rhinitis due to pollen: Secondary | ICD-10-CM | POA: Diagnosis not present

## 2023-04-20 DIAGNOSIS — J3089 Other allergic rhinitis: Secondary | ICD-10-CM | POA: Diagnosis not present

## 2023-04-20 DIAGNOSIS — J301 Allergic rhinitis due to pollen: Secondary | ICD-10-CM | POA: Diagnosis not present

## 2023-04-20 DIAGNOSIS — J3081 Allergic rhinitis due to animal (cat) (dog) hair and dander: Secondary | ICD-10-CM | POA: Diagnosis not present

## 2023-04-22 DIAGNOSIS — M5412 Radiculopathy, cervical region: Secondary | ICD-10-CM | POA: Diagnosis not present

## 2023-04-22 DIAGNOSIS — M47812 Spondylosis without myelopathy or radiculopathy, cervical region: Secondary | ICD-10-CM | POA: Diagnosis not present

## 2023-04-27 DIAGNOSIS — J301 Allergic rhinitis due to pollen: Secondary | ICD-10-CM | POA: Diagnosis not present

## 2023-04-27 DIAGNOSIS — J3081 Allergic rhinitis due to animal (cat) (dog) hair and dander: Secondary | ICD-10-CM | POA: Diagnosis not present

## 2023-04-27 DIAGNOSIS — J3089 Other allergic rhinitis: Secondary | ICD-10-CM | POA: Diagnosis not present

## 2023-05-04 DIAGNOSIS — J301 Allergic rhinitis due to pollen: Secondary | ICD-10-CM | POA: Diagnosis not present

## 2023-05-04 DIAGNOSIS — J3089 Other allergic rhinitis: Secondary | ICD-10-CM | POA: Diagnosis not present

## 2023-05-04 DIAGNOSIS — J3081 Allergic rhinitis due to animal (cat) (dog) hair and dander: Secondary | ICD-10-CM | POA: Diagnosis not present

## 2023-05-10 DIAGNOSIS — M5412 Radiculopathy, cervical region: Secondary | ICD-10-CM | POA: Diagnosis not present

## 2023-05-10 DIAGNOSIS — M47812 Spondylosis without myelopathy or radiculopathy, cervical region: Secondary | ICD-10-CM | POA: Diagnosis not present

## 2023-05-11 DIAGNOSIS — J3081 Allergic rhinitis due to animal (cat) (dog) hair and dander: Secondary | ICD-10-CM | POA: Diagnosis not present

## 2023-05-11 DIAGNOSIS — J3089 Other allergic rhinitis: Secondary | ICD-10-CM | POA: Diagnosis not present

## 2023-05-11 DIAGNOSIS — J301 Allergic rhinitis due to pollen: Secondary | ICD-10-CM | POA: Diagnosis not present

## 2023-05-18 DIAGNOSIS — J301 Allergic rhinitis due to pollen: Secondary | ICD-10-CM | POA: Diagnosis not present

## 2023-05-18 DIAGNOSIS — J3081 Allergic rhinitis due to animal (cat) (dog) hair and dander: Secondary | ICD-10-CM | POA: Diagnosis not present

## 2023-05-18 DIAGNOSIS — J3089 Other allergic rhinitis: Secondary | ICD-10-CM | POA: Diagnosis not present

## 2023-05-22 DIAGNOSIS — M5412 Radiculopathy, cervical region: Secondary | ICD-10-CM | POA: Diagnosis not present

## 2023-05-22 DIAGNOSIS — M47812 Spondylosis without myelopathy or radiculopathy, cervical region: Secondary | ICD-10-CM | POA: Diagnosis not present

## 2023-05-25 DIAGNOSIS — J3081 Allergic rhinitis due to animal (cat) (dog) hair and dander: Secondary | ICD-10-CM | POA: Diagnosis not present

## 2023-05-25 DIAGNOSIS — J3089 Other allergic rhinitis: Secondary | ICD-10-CM | POA: Diagnosis not present

## 2023-05-25 DIAGNOSIS — J301 Allergic rhinitis due to pollen: Secondary | ICD-10-CM | POA: Diagnosis not present

## 2023-06-01 DIAGNOSIS — J3081 Allergic rhinitis due to animal (cat) (dog) hair and dander: Secondary | ICD-10-CM | POA: Diagnosis not present

## 2023-06-01 DIAGNOSIS — J3089 Other allergic rhinitis: Secondary | ICD-10-CM | POA: Diagnosis not present

## 2023-06-01 DIAGNOSIS — J301 Allergic rhinitis due to pollen: Secondary | ICD-10-CM | POA: Diagnosis not present

## 2023-06-08 DIAGNOSIS — J3081 Allergic rhinitis due to animal (cat) (dog) hair and dander: Secondary | ICD-10-CM | POA: Diagnosis not present

## 2023-06-08 DIAGNOSIS — J301 Allergic rhinitis due to pollen: Secondary | ICD-10-CM | POA: Diagnosis not present

## 2023-06-08 DIAGNOSIS — J3089 Other allergic rhinitis: Secondary | ICD-10-CM | POA: Diagnosis not present

## 2023-06-16 DIAGNOSIS — J301 Allergic rhinitis due to pollen: Secondary | ICD-10-CM | POA: Diagnosis not present

## 2023-06-16 DIAGNOSIS — J3089 Other allergic rhinitis: Secondary | ICD-10-CM | POA: Diagnosis not present

## 2023-06-16 DIAGNOSIS — J3081 Allergic rhinitis due to animal (cat) (dog) hair and dander: Secondary | ICD-10-CM | POA: Diagnosis not present

## 2023-06-22 DIAGNOSIS — J3081 Allergic rhinitis due to animal (cat) (dog) hair and dander: Secondary | ICD-10-CM | POA: Diagnosis not present

## 2023-06-22 DIAGNOSIS — J301 Allergic rhinitis due to pollen: Secondary | ICD-10-CM | POA: Diagnosis not present

## 2023-06-22 DIAGNOSIS — M47812 Spondylosis without myelopathy or radiculopathy, cervical region: Secondary | ICD-10-CM | POA: Diagnosis not present

## 2023-06-22 DIAGNOSIS — M5412 Radiculopathy, cervical region: Secondary | ICD-10-CM | POA: Diagnosis not present

## 2023-06-22 DIAGNOSIS — J3089 Other allergic rhinitis: Secondary | ICD-10-CM | POA: Diagnosis not present

## 2023-06-29 DIAGNOSIS — J301 Allergic rhinitis due to pollen: Secondary | ICD-10-CM | POA: Diagnosis not present

## 2023-06-29 DIAGNOSIS — J3089 Other allergic rhinitis: Secondary | ICD-10-CM | POA: Diagnosis not present

## 2023-06-29 DIAGNOSIS — J3081 Allergic rhinitis due to animal (cat) (dog) hair and dander: Secondary | ICD-10-CM | POA: Diagnosis not present

## 2023-07-06 DIAGNOSIS — J301 Allergic rhinitis due to pollen: Secondary | ICD-10-CM | POA: Diagnosis not present

## 2023-07-06 DIAGNOSIS — J3089 Other allergic rhinitis: Secondary | ICD-10-CM | POA: Diagnosis not present

## 2023-07-06 DIAGNOSIS — J3081 Allergic rhinitis due to animal (cat) (dog) hair and dander: Secondary | ICD-10-CM | POA: Diagnosis not present

## 2023-07-13 DIAGNOSIS — J3089 Other allergic rhinitis: Secondary | ICD-10-CM | POA: Diagnosis not present

## 2023-07-13 DIAGNOSIS — J301 Allergic rhinitis due to pollen: Secondary | ICD-10-CM | POA: Diagnosis not present

## 2023-07-13 DIAGNOSIS — J3081 Allergic rhinitis due to animal (cat) (dog) hair and dander: Secondary | ICD-10-CM | POA: Diagnosis not present

## 2023-07-20 DIAGNOSIS — J301 Allergic rhinitis due to pollen: Secondary | ICD-10-CM | POA: Diagnosis not present

## 2023-07-20 DIAGNOSIS — J3081 Allergic rhinitis due to animal (cat) (dog) hair and dander: Secondary | ICD-10-CM | POA: Diagnosis not present

## 2023-07-20 DIAGNOSIS — J3089 Other allergic rhinitis: Secondary | ICD-10-CM | POA: Diagnosis not present

## 2023-07-22 DIAGNOSIS — M5412 Radiculopathy, cervical region: Secondary | ICD-10-CM | POA: Diagnosis not present

## 2023-07-22 DIAGNOSIS — M47812 Spondylosis without myelopathy or radiculopathy, cervical region: Secondary | ICD-10-CM | POA: Diagnosis not present

## 2023-07-27 DIAGNOSIS — J3089 Other allergic rhinitis: Secondary | ICD-10-CM | POA: Diagnosis not present

## 2023-07-27 DIAGNOSIS — J301 Allergic rhinitis due to pollen: Secondary | ICD-10-CM | POA: Diagnosis not present

## 2023-07-27 DIAGNOSIS — J3081 Allergic rhinitis due to animal (cat) (dog) hair and dander: Secondary | ICD-10-CM | POA: Diagnosis not present

## 2023-08-03 DIAGNOSIS — J3089 Other allergic rhinitis: Secondary | ICD-10-CM | POA: Diagnosis not present

## 2023-08-03 DIAGNOSIS — J3081 Allergic rhinitis due to animal (cat) (dog) hair and dander: Secondary | ICD-10-CM | POA: Diagnosis not present

## 2023-08-03 DIAGNOSIS — J301 Allergic rhinitis due to pollen: Secondary | ICD-10-CM | POA: Diagnosis not present

## 2023-08-10 DIAGNOSIS — M5030 Other cervical disc degeneration, unspecified cervical region: Secondary | ICD-10-CM | POA: Diagnosis not present

## 2023-08-10 DIAGNOSIS — M792 Neuralgia and neuritis, unspecified: Secondary | ICD-10-CM | POA: Diagnosis not present

## 2023-08-10 DIAGNOSIS — Z0000 Encounter for general adult medical examination without abnormal findings: Secondary | ICD-10-CM | POA: Diagnosis not present

## 2023-08-10 DIAGNOSIS — I7300 Raynaud's syndrome without gangrene: Secondary | ICD-10-CM | POA: Diagnosis not present

## 2023-08-10 DIAGNOSIS — Z23 Encounter for immunization: Secondary | ICD-10-CM | POA: Diagnosis not present

## 2023-08-10 DIAGNOSIS — E7800 Pure hypercholesterolemia, unspecified: Secondary | ICD-10-CM | POA: Diagnosis not present

## 2023-08-10 DIAGNOSIS — K9089 Other intestinal malabsorption: Secondary | ICD-10-CM | POA: Diagnosis not present

## 2023-08-10 DIAGNOSIS — J3089 Other allergic rhinitis: Secondary | ICD-10-CM | POA: Diagnosis not present

## 2023-08-10 DIAGNOSIS — J3081 Allergic rhinitis due to animal (cat) (dog) hair and dander: Secondary | ICD-10-CM | POA: Diagnosis not present

## 2023-08-10 DIAGNOSIS — K219 Gastro-esophageal reflux disease without esophagitis: Secondary | ICD-10-CM | POA: Diagnosis not present

## 2023-08-10 DIAGNOSIS — J301 Allergic rhinitis due to pollen: Secondary | ICD-10-CM | POA: Diagnosis not present

## 2023-08-10 DIAGNOSIS — M810 Age-related osteoporosis without current pathological fracture: Secondary | ICD-10-CM | POA: Diagnosis not present

## 2023-08-10 DIAGNOSIS — Z79899 Other long term (current) drug therapy: Secondary | ICD-10-CM | POA: Diagnosis not present

## 2023-08-10 DIAGNOSIS — E222 Syndrome of inappropriate secretion of antidiuretic hormone: Secondary | ICD-10-CM | POA: Diagnosis not present

## 2023-08-17 DIAGNOSIS — J3081 Allergic rhinitis due to animal (cat) (dog) hair and dander: Secondary | ICD-10-CM | POA: Diagnosis not present

## 2023-08-17 DIAGNOSIS — J301 Allergic rhinitis due to pollen: Secondary | ICD-10-CM | POA: Diagnosis not present

## 2023-08-17 DIAGNOSIS — J3089 Other allergic rhinitis: Secondary | ICD-10-CM | POA: Diagnosis not present

## 2023-08-22 DIAGNOSIS — M5412 Radiculopathy, cervical region: Secondary | ICD-10-CM | POA: Diagnosis not present

## 2023-08-22 DIAGNOSIS — M47812 Spondylosis without myelopathy or radiculopathy, cervical region: Secondary | ICD-10-CM | POA: Diagnosis not present

## 2023-08-24 DIAGNOSIS — J301 Allergic rhinitis due to pollen: Secondary | ICD-10-CM | POA: Diagnosis not present

## 2023-08-24 DIAGNOSIS — J3081 Allergic rhinitis due to animal (cat) (dog) hair and dander: Secondary | ICD-10-CM | POA: Diagnosis not present

## 2023-08-24 DIAGNOSIS — J3089 Other allergic rhinitis: Secondary | ICD-10-CM | POA: Diagnosis not present

## 2023-08-25 DIAGNOSIS — J3089 Other allergic rhinitis: Secondary | ICD-10-CM | POA: Diagnosis not present

## 2023-08-25 DIAGNOSIS — J301 Allergic rhinitis due to pollen: Secondary | ICD-10-CM | POA: Diagnosis not present

## 2023-08-31 DIAGNOSIS — J301 Allergic rhinitis due to pollen: Secondary | ICD-10-CM | POA: Diagnosis not present

## 2023-08-31 DIAGNOSIS — J3089 Other allergic rhinitis: Secondary | ICD-10-CM | POA: Diagnosis not present

## 2023-08-31 DIAGNOSIS — J3081 Allergic rhinitis due to animal (cat) (dog) hair and dander: Secondary | ICD-10-CM | POA: Diagnosis not present

## 2023-09-07 DIAGNOSIS — J301 Allergic rhinitis due to pollen: Secondary | ICD-10-CM | POA: Diagnosis not present

## 2023-09-07 DIAGNOSIS — J3089 Other allergic rhinitis: Secondary | ICD-10-CM | POA: Diagnosis not present

## 2023-09-07 DIAGNOSIS — J3081 Allergic rhinitis due to animal (cat) (dog) hair and dander: Secondary | ICD-10-CM | POA: Diagnosis not present

## 2023-09-14 DIAGNOSIS — J3089 Other allergic rhinitis: Secondary | ICD-10-CM | POA: Diagnosis not present

## 2023-09-14 DIAGNOSIS — J3081 Allergic rhinitis due to animal (cat) (dog) hair and dander: Secondary | ICD-10-CM | POA: Diagnosis not present

## 2023-09-14 DIAGNOSIS — J301 Allergic rhinitis due to pollen: Secondary | ICD-10-CM | POA: Diagnosis not present

## 2023-09-22 DIAGNOSIS — J3089 Other allergic rhinitis: Secondary | ICD-10-CM | POA: Diagnosis not present

## 2023-09-22 DIAGNOSIS — M47812 Spondylosis without myelopathy or radiculopathy, cervical region: Secondary | ICD-10-CM | POA: Diagnosis not present

## 2023-09-22 DIAGNOSIS — M5412 Radiculopathy, cervical region: Secondary | ICD-10-CM | POA: Diagnosis not present

## 2023-09-22 DIAGNOSIS — J3081 Allergic rhinitis due to animal (cat) (dog) hair and dander: Secondary | ICD-10-CM | POA: Diagnosis not present

## 2023-09-22 DIAGNOSIS — J301 Allergic rhinitis due to pollen: Secondary | ICD-10-CM | POA: Diagnosis not present

## 2023-09-28 DIAGNOSIS — J3081 Allergic rhinitis due to animal (cat) (dog) hair and dander: Secondary | ICD-10-CM | POA: Diagnosis not present

## 2023-09-28 DIAGNOSIS — J3089 Other allergic rhinitis: Secondary | ICD-10-CM | POA: Diagnosis not present

## 2023-09-28 DIAGNOSIS — J301 Allergic rhinitis due to pollen: Secondary | ICD-10-CM | POA: Diagnosis not present

## 2023-09-29 ENCOUNTER — Other Ambulatory Visit: Payer: Self-pay | Admitting: Internal Medicine

## 2023-09-29 DIAGNOSIS — Z1231 Encounter for screening mammogram for malignant neoplasm of breast: Secondary | ICD-10-CM

## 2023-09-30 ENCOUNTER — Inpatient Hospital Stay
Admission: RE | Admit: 2023-09-30 | Discharge: 2023-09-30 | Discharge status: Home / Self Care | Source: Ambulatory Visit | Attending: Internal Medicine | Admitting: Internal Medicine

## 2023-09-30 DIAGNOSIS — Z1231 Encounter for screening mammogram for malignant neoplasm of breast: Secondary | ICD-10-CM

## 2023-10-05 DIAGNOSIS — J3081 Allergic rhinitis due to animal (cat) (dog) hair and dander: Secondary | ICD-10-CM | POA: Diagnosis not present

## 2023-10-05 DIAGNOSIS — J301 Allergic rhinitis due to pollen: Secondary | ICD-10-CM | POA: Diagnosis not present

## 2023-10-05 DIAGNOSIS — J3089 Other allergic rhinitis: Secondary | ICD-10-CM | POA: Diagnosis not present

## 2023-10-10 ENCOUNTER — Other Ambulatory Visit: Payer: Self-pay | Admitting: Gastroenterology

## 2023-10-12 DIAGNOSIS — J301 Allergic rhinitis due to pollen: Secondary | ICD-10-CM | POA: Diagnosis not present

## 2023-10-12 DIAGNOSIS — J3089 Other allergic rhinitis: Secondary | ICD-10-CM | POA: Diagnosis not present

## 2023-10-12 DIAGNOSIS — J3081 Allergic rhinitis due to animal (cat) (dog) hair and dander: Secondary | ICD-10-CM | POA: Diagnosis not present

## 2023-10-19 DIAGNOSIS — J3089 Other allergic rhinitis: Secondary | ICD-10-CM | POA: Diagnosis not present

## 2023-10-19 DIAGNOSIS — J301 Allergic rhinitis due to pollen: Secondary | ICD-10-CM | POA: Diagnosis not present

## 2023-10-19 DIAGNOSIS — J3081 Allergic rhinitis due to animal (cat) (dog) hair and dander: Secondary | ICD-10-CM | POA: Diagnosis not present

## 2023-10-26 DIAGNOSIS — J3089 Other allergic rhinitis: Secondary | ICD-10-CM | POA: Diagnosis not present

## 2023-10-26 DIAGNOSIS — J301 Allergic rhinitis due to pollen: Secondary | ICD-10-CM | POA: Diagnosis not present

## 2023-10-26 DIAGNOSIS — J3081 Allergic rhinitis due to animal (cat) (dog) hair and dander: Secondary | ICD-10-CM | POA: Diagnosis not present

## 2023-11-02 DIAGNOSIS — J301 Allergic rhinitis due to pollen: Secondary | ICD-10-CM | POA: Diagnosis not present

## 2023-11-02 DIAGNOSIS — J3089 Other allergic rhinitis: Secondary | ICD-10-CM | POA: Diagnosis not present

## 2023-11-02 DIAGNOSIS — J3081 Allergic rhinitis due to animal (cat) (dog) hair and dander: Secondary | ICD-10-CM | POA: Diagnosis not present

## 2023-11-06 ENCOUNTER — Ambulatory Visit (AMBULATORY_SURGERY_CENTER)

## 2023-11-06 VITALS — Ht 63.5 in | Wt 111.0 lb

## 2023-11-06 DIAGNOSIS — Z860100 Personal history of colon polyps, unspecified: Secondary | ICD-10-CM

## 2023-11-06 MED ORDER — ONDANSETRON HCL 4 MG PO TABS: 4.0000 mg | ORAL_TABLET | Freq: Three times a day (TID) | ORAL | 0 refills | Status: AC | PRN

## 2023-11-06 MED ORDER — NA SULFATE-K SULFATE-MG SULF 17.5-3.13-1.6 GM/177ML PO SOLN: 1.0000 | Freq: Once | ORAL | 0 refills | Status: AC

## 2023-11-06 NOTE — Progress Notes (Signed)
 Pre visit completed in person; Patient verified name, DOB, and address; No egg or soy allergy known to patient;  No issues known to pt with past sedation with any surgeries or procedures; Patient denies ever being told they had issues or difficulty with intubation;  No FH of Malignant Hyperthermia; Pt is not on diet pills; Pt is not on home 02; Pt is not on blood thinners;  Pt denies issues with constipation; No A fib or A flutter; Have any cardiac testing pending--NO Insurance verified during PV appt--- [employer] Medicare Pt can ambulate without assistance;  Pt denies use of chewing tobacco; Discussed diabetic/weight loss medication holds; Discussed NSAID holds; Checked BMI to be less than 50; Pt instructed to use Single[employer] or GoodRx for a price reduction on prep;  Patient's chart reviewed by Norleen Schillings CNRA prior to previsit and patient appropriate for the [facility]; Pre visit completed and red dot placed by patient's name on their procedure day (on provider's schedule);  Instructions sent to MyChart as well as printed and given to the patient at time of PV appt;  Zofran Rx sent to pharmacy per patient request and instructions hand written on printed instructions as RX was requested after printing instructions; patient to take Zofran 30 minutes prior to starting prep;

## 2023-11-09 DIAGNOSIS — K219 Gastro-esophageal reflux disease without esophagitis: Secondary | ICD-10-CM | POA: Diagnosis not present

## 2023-11-09 DIAGNOSIS — J301 Allergic rhinitis due to pollen: Secondary | ICD-10-CM | POA: Diagnosis not present

## 2023-11-09 DIAGNOSIS — K8681 Exocrine pancreatic insufficiency: Secondary | ICD-10-CM | POA: Diagnosis not present

## 2023-11-09 DIAGNOSIS — M810 Age-related osteoporosis without current pathological fracture: Secondary | ICD-10-CM | POA: Diagnosis not present

## 2023-11-09 DIAGNOSIS — J3089 Other allergic rhinitis: Secondary | ICD-10-CM | POA: Diagnosis not present

## 2023-11-09 DIAGNOSIS — Z8639 Personal history of other endocrine, nutritional and metabolic disease: Secondary | ICD-10-CM | POA: Diagnosis not present

## 2023-11-12 ENCOUNTER — Encounter: Payer: Self-pay | Admitting: Gastroenterology

## 2023-11-16 DIAGNOSIS — J3081 Allergic rhinitis due to animal (cat) (dog) hair and dander: Secondary | ICD-10-CM | POA: Diagnosis not present

## 2023-11-16 DIAGNOSIS — J3089 Other allergic rhinitis: Secondary | ICD-10-CM | POA: Diagnosis not present

## 2023-11-16 DIAGNOSIS — J301 Allergic rhinitis due to pollen: Secondary | ICD-10-CM | POA: Diagnosis not present

## 2023-11-20 ENCOUNTER — Ambulatory Visit: Admitting: Gastroenterology

## 2023-11-20 ENCOUNTER — Encounter: Payer: Self-pay | Admitting: Gastroenterology

## 2023-11-20 VITALS — BP 145/89 | HR 62 | Temp 97.4°F | Resp 10 | Ht 63.5 in | Wt 111.0 lb

## 2023-11-20 DIAGNOSIS — K644 Residual hemorrhoidal skin tags: Secondary | ICD-10-CM | POA: Diagnosis not present

## 2023-11-20 DIAGNOSIS — D122 Benign neoplasm of ascending colon: Secondary | ICD-10-CM

## 2023-11-20 DIAGNOSIS — K648 Other hemorrhoids: Secondary | ICD-10-CM

## 2023-11-20 DIAGNOSIS — Z860100 Personal history of colon polyps, unspecified: Secondary | ICD-10-CM

## 2023-11-20 DIAGNOSIS — F419 Anxiety disorder, unspecified: Secondary | ICD-10-CM | POA: Diagnosis not present

## 2023-11-20 DIAGNOSIS — Z1211 Encounter for screening for malignant neoplasm of colon: Secondary | ICD-10-CM

## 2023-11-20 DIAGNOSIS — Z860101 Personal history of adenomatous and serrated colon polyps: Secondary | ICD-10-CM | POA: Diagnosis not present

## 2023-11-20 MED ORDER — SODIUM CHLORIDE 0.9 % IV SOLN: 500.0000 mL | INTRAVENOUS | Status: DC

## 2023-11-20 NOTE — Progress Notes (Signed)
 Pt's states no medical or surgical changes since previsit or office visit.

## 2023-11-20 NOTE — Progress Notes (Signed)
 [facility] History and Physical   Primary Care Physician:  Dwight Trula SQUIBB, MD   Reason for Procedure:  History of adenomatous colon polyps  Plan:    Surveillance colonoscopy with possible interventions as needed     HPI: Vicki Jarvis is a very pleasant 68 y.o. female here for surveillance colonoscopy. Denies any nausea, vomiting, abdominal pain, melena or bright red blood per rectum  The risks and benefits as well as alternatives of endoscopic procedure(s) have been discussed and reviewed.  The patient was provided an opportunity to ask questions and all were answered. The patient agreed with the plan and demonstrated an understanding of the instructions.   Past Medical History:  Diagnosis Date   Allergy    Anxiety    Arthritis    DDD (degenerative disc disease), cervical    tx'd   Depression    hx of   GERD (gastroesophageal reflux disease)    Lymphocytic colitis    Panic disorder    hx of   Patella fracture     Past Surgical History:  Procedure Laterality Date   COLONOSCOPY  2020   KN-MAC-prep fair-TA x1   ESOPHAGOGASTRODUODENOSCOPY ENDOSCOPY     thumb surgery Right     Prior to Admission medications   Medication Sig Start Date End Date Taking? Authorizing Provider  cetirizine (ZYRTEC) 10 MG tablet Take 10 mg by mouth.   Yes [provider]  CREON  36000-114000 units CPEP capsule TAKE 2 CAPSULES DURING [facility] MEAL AND 1 CAPSULE DURING EACH SNACK. 10/12/23  Yes Jennessy Sandridge V, MD  Lactobacillus Rhamnosus, GG, (CULTURELLE) CAPS Take 1 capsule by mouth daily. 08/15/21  Yes [provider]  METAMUCIL FIBER PO Take 1 Dose by mouth daily at 6 (six) AM.   Yes [provider]  Multiple Vitamin (MULTIVITAMIN) tablet Take 1 tablet by mouth daily.   Yes [provider]  pantoprazole (PROTONIX) 40 MG tablet Take 40 mg by mouth daily.   Yes [provider]  Ca Phosphate-Cholecalciferol (CALCIUM 500 + D3) 250-12.5  MG-MCG CHEW Chew 1 tablet by mouth daily.    [provider]  dicyclomine  (BENTYL ) 20 MG tablet Take 20 mg by mouth every 6 (six) hours as needed for spasms. Patient not taking: Reported on 11/20/2023    [provider]  EPINEPHrine 0.3 mg/0.3 mL IJ SOAJ injection Inject into the muscle as directed. Patient not taking: Reported on 11/20/2023 10/02/20   [provider]  MAGNESIUM GLUCONATE PO Take 100 mg by mouth.    [provider]  ondansetron (ZOFRAN) 4 MG tablet Take 1 tablet (4 mg total) by mouth every 8 (eight) hours as needed for nausea or vomiting. Take 30 minutes prior to starting prep Patient not taking: Reported on 11/20/2023 11/06/23   Aubria Vanecek V, MD  Propylene Glycol (SYSTANE COMPLETE) 0.6 % SOLN Place 1 drop into both eyes daily at 6 (six) AM.    [provider]  TYMLOS 3120 MCG/1.56ML SOPN Inject 1 Dose as directed at bedtime. 02/04/23   [provider]  UNABLE TO FIND Pt gets allergy shots once a week    [provider]  Vitamin A 2400 MCG (8000 UT) TABS Take 1 tablet by mouth daily.    [provider]  vitamin B-12 (CYANOCOBALAMIN ) 100 MCG tablet Take 1 tablet by mouth daily.    [provider]  Vitamin E 90 MG (200 UNIT) CAPS Take 1 capsule by mouth daily.    [provider]  Zinc 30 MG CAPS Take 1 capsule by mouth daily. 07/18/21   [provider]    Current Outpatient Medications  Medication Sig Dispense Refill   cetirizine (ZYRTEC) 10 MG tablet Take 10 mg by mouth.     CREON  36000-114000 units CPEP capsule TAKE 2 CAPSULES DURING [facility] MEAL AND 1 CAPSULE DURING [employer] 600 capsule 1   Lactobacillus Rhamnosus, GG, (CULTURELLE) CAPS Take 1 capsule by mouth daily.     METAMUCIL FIBER PO Take 1 Dose by mouth daily at 6 (six) AM.     Multiple Vitamin (MULTIVITAMIN) tablet Take 1 tablet by mouth daily.     pantoprazole (PROTONIX) 40 MG tablet Take 40 mg by mouth daily.      Ca Phosphate-Cholecalciferol (CALCIUM 500 + D3) 250-12.5 MG-MCG CHEW Chew 1 tablet by mouth daily.     dicyclomine  (BENTYL ) 20 MG tablet Take 20 mg by mouth every 6 (six) hours as needed for spasms. (Patient not taking: Reported on 11/20/2023)     EPINEPHrine 0.3 mg/0.3 mL IJ SOAJ injection Inject into the muscle as directed. (Patient not taking: Reported on 11/20/2023)     MAGNESIUM GLUCONATE PO Take 100 mg by mouth.     ondansetron (ZOFRAN) 4 MG tablet Take 1 tablet (4 mg total) by mouth every 8 (eight) hours as needed for nausea or vomiting. Take 30 minutes prior to starting prep (Patient not taking: Reported on 11/20/2023) 4 tablet 0   Propylene Glycol (SYSTANE COMPLETE) 0.6 % SOLN Place 1 drop into both eyes daily at 6 (six) AM.     TYMLOS 3120 MCG/1.56ML SOPN Inject 1 Dose as directed at bedtime.     UNABLE TO FIND Pt gets allergy shots once a week     Vitamin A 2400 MCG (8000 UT) TABS Take 1 tablet by mouth daily.     vitamin B-12 (CYANOCOBALAMIN ) 100 MCG tablet Take 1 tablet by mouth daily.     Vitamin E 90 MG (200 UNIT) CAPS Take 1 capsule by mouth daily.     Zinc 30 MG CAPS Take 1 capsule by mouth daily.     Current Facility-Administered Medications  Medication Dose Route Frequency Provider Last Rate Last Admin   0.9 %  sodium chloride  infusion  500 mL Intravenous Continuous Kiernan Atkerson V, MD        Allergies as of 11/20/2023 - Review Complete 11/20/2023  Allergen Reaction Noted   Doxycycline Dermatitis, Itching, and Other (See Comments) 05/26/2008   Penicillins Dermatitis, Itching, and Other (See Comments) 05/26/2008   Alendronate sodium Other (See Comments) 07/16/2021   Duloxetine Other (See Comments) 07/16/2021   Pregabalin Other (See Comments) 07/16/2021   Colesevelam  Dermatitis 06/03/2018   Welchol  [colesevelam  hcl] Rash 06/03/2018    Family History  Problem Relation Age of Onset   Breast cancer Mother 62   Ulcerative colitis Mother    Heart disease Mother     Esophagitis Mother    Irritable bowel syndrome Sister    Heart disease Other        grandfather   Heart disease Other        uncle   Colon polyps Neg Hx    Diabetes Neg Hx    Esophageal cancer Neg Hx    Kidney disease Neg Hx    Gallbladder disease Neg Hx    Stomach cancer Neg Hx    Rectal cancer Neg Hx     Social History   Socioeconomic History   Marital status:  Married    Spouse name: Not on file   Number of children: 0   Years of education: Not on file   Highest education level: Not on file  Occupational History   Occupation: Secretary/retired  Tobacco Use   Smoking status: Former    Current packs/day: 0.00    Types: Cigarettes    Quit date: 01/15/1981    Years since quitting: 42.8   Smokeless tobacco: Never  Vaping Use   Vaping status: Never Used  Substance and Sexual Activity   Alcohol use: Not Currently    Comment: Occassionally   Drug use: No   Sexual activity: Not on file  Other Topics Concern   Not on file  Social History Narrative   Not on file   Social Drivers of Health   Financial Resource Strain: Not on file  Food Insecurity: Not on file  Transportation Needs: Not on file  Physical Activity: Not on file  Stress: No Stress Concern Present (12/04/2021)   [facility] prior to 03/22/2022.   [employer] of Occupational Health - Occupational Stress Questionnaire    Feeling of Stress : Not at all  Social Connections: Not on file  Intimate Partner Violence: Not At Risk (12/04/2021)   Received from [facility] visits prior to 03/22/2022.   Humiliation, Afraid, Rape, and Kick questionnaire    Within the last year, have you been afraid of your partner or ex-partner?: No    Within the last year, have you been humiliated or emotionally abused in other ways by your partner or ex-partner?: No    Within the last year, have you been kicked, hit, slapped, or otherwise physically  hurt by your partner or ex-partner?: No    Within the last year, have you been raped or forced to have any kind of sexual activity by your partner or ex-partner?: No    Review of Systems:  All other review of systems negative except as mentioned in the HPI.  Physical Exam: Vital signs in last 24 hours: BP (!) 152/80   Pulse 76   Temp (!) 97.4 F (36.3 C)   Resp 13   Ht 5' 3.5 (1.613 m)   Wt 111 lb (50.3 kg)   SpO2 97%   BMI 19.35 kg/m  General:   Alert, NAD Lungs:  Clear .   Heart:  Regular rate and rhythm Abdomen:  Soft, nontender and nondistended. Neuro/Psych:  Alert and cooperative. Normal mood and affect. A and O x 3  Reviewed labs, radiology imaging, old records and pertinent past GI work up  Patient is appropriate for planned procedure(s) and anesthesia in an ambulatory setting   K. Veena Sammie Denner , MD [phone]

## 2023-11-20 NOTE — Progress Notes (Signed)
 Called to room to assist during endoscopic procedure.  Patient ID and intended procedure confirmed with present staff. Received instructions for my participation in the procedure from the performing physician.

## 2023-11-20 NOTE — Op Note (Signed)
 [facility] Patient Name: Vicki Jarvis Procedure Date: 11/20/2023 8:26 AM MRN: [id number] Endoscopist: Gustav ALONSO Mcgee , MD, [id number] Age: 68 Referring MD:  Date of Birth: 08/16/1955 Gender: Female Account #: [id number] Procedure:                Colonoscopy Indications:              High risk colon cancer surveillance: Personal                            history of colonic polyps, High risk colon cancer                            surveillance: Personal history of adenoma less than                            10 mm in size Medicines:                Monitored Anesthesia Care Procedure:                Pre-Anesthesia Assessment:                           - Prior to the procedure, a History and Physical                            was performed, and patient medications and                            allergies were reviewed. The patient's tolerance of                            previous anesthesia was also reviewed. The risks                            and benefits of the procedure and the sedation                            options and risks were discussed with the patient.                            All questions were answered, and informed consent                            was obtained. Prior Anticoagulants: The patient has                            taken no anticoagulant or antiplatelet agents. ASA                            Grade Assessment: II - A patient with mild systemic                            disease. After reviewing the risks and benefits,  the patient was deemed in satisfactory condition to                            undergo the procedure.                           After obtaining informed consent, the colonoscope                            was passed under direct vision. Throughout the                            procedure, the patient's blood pressure, pulse, and                            oxygen saturations were monitored  continuously. The                            [id number]-HQ190L Colonoscope [id number] was introduced                            through the anus and advanced to the the terminal                            ileum, with identification of the appendiceal                            orifice and IC valve. The colonoscopy was performed                            without difficulty. The patient tolerated the                            procedure well. The quality of the bowel                            preparation was adequate. The ileocecal valve,                            appendiceal orifice, and rectum were photographed. Scope In: 8:47:46 AM Scope Out: 9:11:05 AM Scope Withdrawal Time: 0 hours 13 minutes 37 seconds  Total Procedure Duration: 0 hours 23 minutes 19 seconds  Findings:                 The perianal and digital rectal examinations were                            normal.                           A 4 mm polyp was found in the ascending colon. The                            polyp was sessile. The polyp was removed with a  cold snare. Resection and retrieval were complete.                           Non-bleeding external and internal hemorrhoids were                            found during retroflexion. The hemorrhoids were                            small. Complications:            No immediate complications. Estimated Blood Loss:     Estimated blood loss was minimal. Impression:               - One 4 mm polyp in the ascending colon, removed                            with a cold snare. Resected and retrieved.                           - Non-bleeding external and internal hemorrhoids. Recommendation:           - Patient has a contact number available for                            emergencies. The signs and symptoms of potential                            delayed complications were discussed with the                            patient. Return to normal activities  tomorrow.                            Written discharge instructions were provided to the                            patient.                           - Resume previous diet.                           - Continue present medications.                           - Await pathology results.                           - Repeat colonoscopy in 5-10 years for surveillance                            based on pathology results. Aubrei Bouchie V. Dene Landsberg, MD 11/20/2023 9:19:39 AM This report has been signed electronically.

## 2023-11-20 NOTE — Patient Instructions (Signed)
 YOU HAD AN ENDOSCOPIC PROCEDURE TODAY AT THE [facility]:   Refer to the procedure report that was given to you for any specific questions about what was found during the examination.  If the procedure report does not answer your questions, please call your gastroenterologist to clarify.  If you requested that your care partner not be given the details of your procedure findings, then the procedure report has been included in a sealed envelope for you to review at your convenience later.  YOU SHOULD EXPECT: Some feelings of bloating in the abdomen. Passage of more gas than usual.  Walking can help get rid of the air that was put into your GI tract during the procedure and reduce the bloating. If you had a lower endoscopy (such as a colonoscopy or flexible sigmoidoscopy) you may notice spotting of blood in your stool or on the toilet paper. If you underwent a bowel prep for your procedure, you may not have a normal bowel movement for a few days.  Please Note:  You might notice some irritation and congestion in your nose or some drainage.  This is from the oxygen used during your procedure.  There is no need for concern and it should clear up in a day or so.  SYMPTOMS TO REPORT IMMEDIATELY:  Following lower endoscopy (colonoscopy or flexible sigmoidoscopy):  Excessive amounts of blood in the stool  Significant tenderness or worsening of abdominal pains  Swelling of the abdomen that is new, acute  Fever of 100F or higher  Resume previous diet Continue present medications Await pathology results Handouts on polyps and hemorrhoids given   For urgent or emergent issues, a gastroenterologist can be reached at any hour by calling [phone]. Do not use MyChart messaging for urgent concerns.    DIET:  We do recommend a small meal at first, but then you may proceed to your regular diet.  Drink plenty of fluids but you should avoid alcoholic beverages for 24 hours.  ACTIVITY:  You  should plan to take it easy for the rest of today and you should NOT DRIVE or use heavy machinery until tomorrow (because of the sedation medicines used during the test).    FOLLOW UP: Our staff will call the number listed on your records the next business day following your procedure.  We will call around 7:15- 8:00 am to check on you and address any questions or concerns that you may have regarding the information given to you following your procedure. If we do not reach you, we will leave a message.     If any biopsies were taken you will be contacted by phone or by letter within the next 1-3 weeks.  Please call us  at [phone] if you have not heard about the biopsies in 3 weeks.    SIGNATURES/CONFIDENTIALITY: You and/or your care partner have signed paperwork which will be entered into your electronic medical record.  These signatures attest to the fact that that the information above on your After Visit Summary has been reviewed and is understood.  Full responsibility of the confidentiality of this discharge information lies with you and/or your care-partner.

## 2023-11-20 NOTE — Progress Notes (Signed)
 To pacu, VSS. Report to [email or web address]

## 2023-11-23 ENCOUNTER — Telehealth: Payer: Self-pay

## 2023-11-23 DIAGNOSIS — J301 Allergic rhinitis due to pollen: Secondary | ICD-10-CM | POA: Diagnosis not present

## 2023-11-23 DIAGNOSIS — J3081 Allergic rhinitis due to animal (cat) (dog) hair and dander: Secondary | ICD-10-CM | POA: Diagnosis not present

## 2023-11-23 DIAGNOSIS — J3089 Other allergic rhinitis: Secondary | ICD-10-CM | POA: Diagnosis not present

## 2023-11-23 NOTE — Telephone Encounter (Signed)
  Follow up Call-     11/20/2023    7:39 AM  Call back number  Post procedure Call Back phone  # [phone]  Permission to leave phone message Yes     Patient questions:  Do you have a fever, pain , or abdominal swelling? No. Pain Score  0 *  Have you tolerated food without any problems? Yes.    Have you been able to return to your normal activities? Yes.    Do you have any questions about your discharge instructions: Diet   No. Medications  No. Follow up visit  No.  Do you have questions or concerns about your Care? No.  Actions: * If pain score is 4 or above: No action needed, pain <4.

## 2023-11-24 LAB — SURGICAL PATHOLOGY

## 2023-11-30 DIAGNOSIS — J3089 Other allergic rhinitis: Secondary | ICD-10-CM | POA: Diagnosis not present

## 2023-11-30 DIAGNOSIS — J3081 Allergic rhinitis due to animal (cat) (dog) hair and dander: Secondary | ICD-10-CM | POA: Diagnosis not present

## 2023-11-30 DIAGNOSIS — J301 Allergic rhinitis due to pollen: Secondary | ICD-10-CM | POA: Diagnosis not present

## 2023-12-02 ENCOUNTER — Ambulatory Visit: Payer: Self-pay | Admitting: Gastroenterology

## 2023-12-04 DIAGNOSIS — M67449 Ganglion, unspecified hand: Secondary | ICD-10-CM | POA: Diagnosis not present

## 2023-12-07 DIAGNOSIS — J301 Allergic rhinitis due to pollen: Secondary | ICD-10-CM | POA: Diagnosis not present

## 2023-12-07 DIAGNOSIS — J3089 Other allergic rhinitis: Secondary | ICD-10-CM | POA: Diagnosis not present

## 2023-12-07 DIAGNOSIS — J3081 Allergic rhinitis due to animal (cat) (dog) hair and dander: Secondary | ICD-10-CM | POA: Diagnosis not present

## 2023-12-14 DIAGNOSIS — J301 Allergic rhinitis due to pollen: Secondary | ICD-10-CM | POA: Diagnosis not present

## 2023-12-14 DIAGNOSIS — J3089 Other allergic rhinitis: Secondary | ICD-10-CM | POA: Diagnosis not present

## 2023-12-14 DIAGNOSIS — J3081 Allergic rhinitis due to animal (cat) (dog) hair and dander: Secondary | ICD-10-CM | POA: Diagnosis not present

## 2023-12-21 DIAGNOSIS — J3081 Allergic rhinitis due to animal (cat) (dog) hair and dander: Secondary | ICD-10-CM | POA: Diagnosis not present

## 2023-12-21 DIAGNOSIS — J3089 Other allergic rhinitis: Secondary | ICD-10-CM | POA: Diagnosis not present

## 2023-12-21 DIAGNOSIS — J301 Allergic rhinitis due to pollen: Secondary | ICD-10-CM | POA: Diagnosis not present

## 2024-01-26 ENCOUNTER — Other Ambulatory Visit: Payer: Self-pay | Admitting: Orthopedic Surgery

## 2024-01-28 LAB — SURGICAL PATHOLOGY
